# Patient Record
Sex: Female | Born: 1955 | Race: White | Hispanic: No | Marital: Married | Smoking: Never smoker
Health system: Southern US, Community
[De-identification: ages and names within clinical notes are randomized; demographics above are authoritative.]

## PROBLEM LIST (undated history)

## (undated) DIAGNOSIS — I10 Essential (primary) hypertension: Secondary | ICD-10-CM

## (undated) DIAGNOSIS — E7800 Pure hypercholesterolemia, unspecified: Secondary | ICD-10-CM

## (undated) DIAGNOSIS — R7303 Prediabetes: Secondary | ICD-10-CM

## (undated) DIAGNOSIS — E782 Mixed hyperlipidemia: Secondary | ICD-10-CM

## (undated) HISTORY — DX: Mixed hyperlipidemia: E78.2

## (undated) HISTORY — DX: Pure hypercholesterolemia, unspecified: E78.00

## (undated) HISTORY — PX: ABDOMINAL HYSTERECTOMY: SHX81

---

## 1983-06-12 HISTORY — PX: TUBAL LIGATION: SHX77

## 2009-07-05 ENCOUNTER — Ambulatory Visit: Payer: Self-pay | Admitting: General Practice

## 2011-11-09 ENCOUNTER — Ambulatory Visit: Payer: Self-pay | Admitting: Unknown Physician Specialty

## 2012-01-29 ENCOUNTER — Ambulatory Visit: Payer: Self-pay | Admitting: General Practice

## 2014-02-04 ENCOUNTER — Ambulatory Visit: Payer: Self-pay | Admitting: General Practice

## 2014-12-14 ENCOUNTER — Other Ambulatory Visit: Payer: Self-pay | Admitting: Physician Assistant

## 2014-12-14 ENCOUNTER — Ambulatory Visit
Admission: RE | Admit: 2014-12-14 | Discharge: 2014-12-14 | Disposition: A | Discharge status: Home / Self Care | Payer: PRIVATE HEALTH INSURANCE | Source: Ambulatory Visit | Attending: Physician Assistant | Admitting: Physician Assistant

## 2014-12-14 DIAGNOSIS — R05 Cough: Secondary | ICD-10-CM

## 2014-12-14 DIAGNOSIS — R059 Cough, unspecified: Secondary | ICD-10-CM

## 2015-05-31 ENCOUNTER — Ambulatory Visit: Payer: Self-pay | Admitting: Physician Assistant

## 2015-05-31 ENCOUNTER — Encounter: Payer: Self-pay | Admitting: Physician Assistant

## 2015-05-31 VITALS — BP 140/98 | HR 119 | Temp 98.1°F

## 2015-05-31 DIAGNOSIS — J069 Acute upper respiratory infection, unspecified: Secondary | ICD-10-CM

## 2015-05-31 DIAGNOSIS — J209 Acute bronchitis, unspecified: Secondary | ICD-10-CM

## 2015-05-31 MED ORDER — HYDROCOD POLST-CPM POLST ER 10-8 MG/5ML PO SUER: 5.0000 mL | Freq: Two times a day (BID) | ORAL | Status: DC | PRN

## 2015-05-31 MED ORDER — AZITHROMYCIN 250 MG PO TABS: ORAL_TABLET | ORAL | Status: DC

## 2015-05-31 MED ORDER — METHYLPREDNISOLONE 4 MG PO TBPK: ORAL_TABLET | ORAL | Status: DC

## 2015-05-31 MED ORDER — FLUTICASONE PROPIONATE 50 MCG/ACT NA SUSP: 2.0000 | Freq: Every day | NASAL | Status: DC

## 2015-05-31 NOTE — Progress Notes (Signed)
S: C/o runny nose and congestion for 3 days, cough, sinus pain and pressure,  no fever, chills, cp/sob, v/d; mucus was green this am but clear throughout the day, cough is sporadic, keeping her awake at night  Using otc meds: alka seltzer  O: [id number] wnl, nad,  perrl eomi, normocephalic, tms dull, nasal mucosa red and swollen, throat injected, neck supple no lymph, lungs c t a, cv rrr, neuro intact, cough is dry and hacking  A:  Acute uri, bronchitis  P: zpack, medrol dose pack, tussionex, flonase, continue inhaler; drink fluids, continue regular meds , use otc meds of choice, return if not improving in 5 days, return earlier if worsening

## 2015-06-22 ENCOUNTER — Encounter: Payer: Self-pay | Admitting: Physician Assistant

## 2015-06-22 ENCOUNTER — Ambulatory Visit: Payer: Self-pay | Admitting: Physician Assistant

## 2015-06-22 VITALS — BP 130/90 | HR 111 | Temp 98.3°F

## 2015-06-22 DIAGNOSIS — J069 Acute upper respiratory infection, unspecified: Secondary | ICD-10-CM

## 2015-06-22 DIAGNOSIS — J209 Acute bronchitis, unspecified: Secondary | ICD-10-CM

## 2015-06-22 MED ORDER — AMOXICILLIN-POT CLAVULANATE 875-125 MG PO TABS: 1.0000 | ORAL_TABLET | Freq: Two times a day (BID) | ORAL | Status: DC

## 2015-06-22 MED ORDER — HYDROCOD POLST-CPM POLST ER 10-8 MG/5ML PO SUER: 5.0000 mL | Freq: Two times a day (BID) | ORAL | Status: DC | PRN

## 2015-06-22 MED ORDER — FLUCONAZOLE 150 MG PO TABS: ORAL_TABLET | ORAL | Status: DC

## 2015-06-22 NOTE — Progress Notes (Signed)
S: continued cough and congestion, finished zpack and steroid pack, couldn't tell if that helped, sx have persisted, has a lot of drainage, sinus pain, and teeth hurt, no fever/chills, no cp/sob  O: vitals wnl, nad, tms dull, nasal mucosa red and swollen, throat wnl, neck supple no lymph, lungs c t a, cv rrr, cough is hacking  A: acute uri, bronchitis  P: augmentin, diflucan, tussionex

## 2015-06-26 ENCOUNTER — Other Ambulatory Visit: Payer: Self-pay | Admitting: Family Medicine

## 2016-02-24 ENCOUNTER — Ambulatory Visit: Payer: Self-pay | Admitting: Physician Assistant

## 2016-03-09 ENCOUNTER — Ambulatory Visit: Payer: Self-pay | Admitting: Physician Assistant

## 2016-03-14 ENCOUNTER — Ambulatory Visit
Admission: RE | Admit: 2016-03-14 | Discharge: 2016-03-14 | Disposition: A | Discharge status: Home / Self Care | Payer: Managed Care, Other (non HMO) | Source: Ambulatory Visit | Attending: Physician Assistant | Admitting: Physician Assistant

## 2016-03-14 ENCOUNTER — Encounter: Payer: Self-pay | Admitting: Physician Assistant

## 2016-03-14 ENCOUNTER — Ambulatory Visit: Payer: Self-pay | Admitting: Physician Assistant

## 2016-03-14 ENCOUNTER — Encounter (INDEPENDENT_AMBULATORY_CARE_PROVIDER_SITE_OTHER): Payer: Self-pay

## 2016-03-14 VITALS — BP 155/95 | HR 98 | Temp 98.3°F | Ht 68.0 in | Wt 243.0 lb

## 2016-03-14 DIAGNOSIS — R05 Cough: Secondary | ICD-10-CM | POA: Insufficient documentation

## 2016-03-14 DIAGNOSIS — R059 Cough, unspecified: Secondary | ICD-10-CM

## 2016-03-14 DIAGNOSIS — Z299 Encounter for prophylactic measures, unspecified: Secondary | ICD-10-CM

## 2016-03-14 DIAGNOSIS — I10 Essential (primary) hypertension: Secondary | ICD-10-CM

## 2016-03-14 MED ORDER — HYDROCHLOROTHIAZIDE 25 MG PO TABS: 25.0000 mg | ORAL_TABLET | Freq: Every day | ORAL | 4 refills | Status: DC

## 2016-03-14 NOTE — Progress Notes (Signed)
S: here for yearly physical, needs mammogram, had normal pap at gyn 2 years ago, never had any issues with pap smears, had colonoscopy few years ago, not time for recheck, ran out of hctz about 2 months ago, bp up since then, denies headaches, abd pain, chest pain, sob, v/d, or dif with urination; does state she has a chronic cough, is dry and hacking  O: vitals wnl, nad, ENT wnl, neck supple no lymph, lungs c t a, cv rrr, no bruits noted at carotids, abd soft nontender bs normal, breast exam normal, no lumps or masses palpated, neuro intact  A: htn, cough, preventive measure  P: mammogram, cxr, ekg for baseline, hctz 25mg  qd #30

## 2016-03-15 LAB — CMP12+LP+TP+TSH+6AC+CBC/D/PLT
A/G RATIO: 1.5 (ref 1.2–2.2)
ALK PHOS: 66 IU/L (ref 39–117)
ALT: 14 IU/L (ref 0–32)
AST: 14 IU/L (ref 0–40)
Albumin: 4.1 g/dL (ref 3.6–4.8)
BASOS: 1 %
BILIRUBIN TOTAL: 0.4 mg/dL (ref 0.0–1.2)
BUN/Creatinine Ratio: 19 (ref 12–28)
BUN: 14 mg/dL (ref 8–27)
Basophils Absolute: 0.1 10*3/uL (ref 0.0–0.2)
CALCIUM: 9.4 mg/dL (ref 8.7–10.3)
CHOL/HDL RATIO: 4.1 ratio (ref 0.0–4.4)
Chloride: 105 mmol/L (ref 96–106)
Cholesterol, Total: 206 mg/dL — ABNORMAL HIGH (ref 100–199)
Creatinine, Ser: 0.74 mg/dL (ref 0.57–1.00)
EOS (ABSOLUTE): 0.3 10*3/uL (ref 0.0–0.4)
Eos: 4 %
Estimated CHD Risk: 0.9 times avg. (ref 0.0–1.0)
FREE THYROXINE INDEX: 2.2 (ref 1.2–4.9)
GFR calc non Af Amer: 88 mL/min/{1.73_m2} (ref >59)
GFR, EST AFRICAN AMERICAN: 101 mL/min/{1.73_m2} (ref >59)
GGT: 18 IU/L (ref 0–60)
GLOBULIN, TOTAL: 2.8 g/dL (ref 1.5–4.5)
Glucose: 113 mg/dL — ABNORMAL HIGH (ref 65–99)
HDL: 50 mg/dL (ref >39)
HEMATOCRIT: 44.3 % (ref 34.0–46.6)
Hemoglobin: 15.2 g/dL (ref 11.1–15.9)
IMMATURE GRANS (ABS): 0 10*3/uL (ref 0.0–0.1)
IMMATURE GRANULOCYTES: 1 %
Iron: 103 ug/dL (ref 27–139)
LDH: 177 IU/L (ref 119–226)
LDL CALC: 130 mg/dL — AB (ref 0–99)
LYMPHS: 42 %
Lymphocytes Absolute: 3 10*3/uL (ref 0.7–3.1)
MCH: 30.8 pg (ref 26.6–33.0)
MCHC: 34.3 g/dL (ref 31.5–35.7)
MCV: 90 fL (ref 79–97)
MONOCYTES: 8 %
Monocytes Absolute: 0.5 10*3/uL (ref 0.1–0.9)
NEUTROS ABS: 3.1 10*3/uL (ref 1.4–7.0)
NEUTROS PCT: 44 %
POTASSIUM: 4.6 mmol/L (ref 3.5–5.2)
Phosphorus: 2.9 mg/dL (ref 2.5–4.5)
Platelets: 312 10*3/uL (ref 150–379)
RBC: 4.94 x10E6/uL (ref 3.77–5.28)
RDW: 12.6 % (ref 12.3–15.4)
SODIUM: 143 mmol/L (ref 134–144)
T3 Uptake Ratio: 26 % (ref 24–39)
T4, Total: 8.6 ug/dL (ref 4.5–12.0)
TSH: 2.61 u[IU]/mL (ref 0.450–4.500)
Total Protein: 6.9 g/dL (ref 6.0–8.5)
Triglycerides: 128 mg/dL (ref 0–149)
Uric Acid: 4.9 mg/dL (ref 2.5–7.1)
VLDL CHOLESTEROL CAL: 26 mg/dL (ref 5–40)
WBC: 7.1 10*3/uL (ref 3.4–10.8)

## 2016-03-15 LAB — VITAMIN D 25 HYDROXY (VIT D DEFICIENCY, FRACTURES): Vit D, 25-Hydroxy: 29 ng/mL — ABNORMAL LOW (ref 30.0–100.0)

## 2016-03-15 LAB — HCV COMMENT:

## 2016-03-15 LAB — HEPATITIS C ANTIBODY (REFLEX): HCV Ab: <0.1 s/co ratio (ref 0.0–0.9)

## 2016-03-21 LAB — SPECIMEN STATUS REPORT

## 2016-03-21 LAB — HGB A1C W/O EAG: HEMOGLOBIN A1C: 5.4 % (ref 4.8–5.6)

## 2016-04-24 ENCOUNTER — Ambulatory Visit
Admission: RE | Admit: 2016-04-24 | Discharge: 2016-04-24 | Disposition: A | Discharge status: Home / Self Care | Payer: Managed Care, Other (non HMO) | Source: Ambulatory Visit | Attending: Physician Assistant | Admitting: Physician Assistant

## 2016-04-24 DIAGNOSIS — Z1231 Encounter for screening mammogram for malignant neoplasm of breast: Secondary | ICD-10-CM | POA: Insufficient documentation

## 2016-05-09 ENCOUNTER — Encounter: Payer: Self-pay | Admitting: Physician Assistant

## 2016-05-09 ENCOUNTER — Ambulatory Visit: Payer: Self-pay | Admitting: Physician Assistant

## 2016-05-09 VITALS — BP 130/80 | HR 104 | Temp 98.1°F

## 2016-05-09 DIAGNOSIS — J069 Acute upper respiratory infection, unspecified: Secondary | ICD-10-CM

## 2016-05-09 LAB — POCT INFLUENZA A/B
Influenza A, POC: NEGATIVE
Influenza B, POC: NEGATIVE

## 2016-05-09 MED ORDER — CEFDINIR 300 MG PO CAPS: 300.0000 mg | ORAL_CAPSULE | Freq: Two times a day (BID) | ORAL | 0 refills | Status: DC

## 2016-05-09 MED ORDER — HYDROCOD POLST-CPM POLST ER 10-8 MG/5ML PO SUER: 5.0000 mL | Freq: Two times a day (BID) | ORAL | 0 refills | Status: DC | PRN

## 2016-05-09 NOTE — Progress Notes (Signed)
S: C/o runny nose and congestion with dry cough for 3 days, + fever, chills on Sunday and MOnday, none now, denies cp/sob, v/d; mucus was green this am,  cough is sporadic, hacking  Using otc meds: robitussin  O: PE: vitals wnl, nad,  perrl eomi, normocephalic, tms dull, nasal mucosa red and swollen, throat injected, neck supple no lymph, lungs c t a, cv rrr, neuro intact, flu swab neg  A:  Acute uri   P: drink fluids, continue regular meds , use otc meds of choice, return if not improving in 5 days, return earlier if worsening , omnicef, tussionex 180ml nr

## 2016-10-05 ENCOUNTER — Ambulatory Visit: Payer: Self-pay | Admitting: Physician Assistant

## 2016-10-05 VITALS — BP 160/90 | HR 112 | Temp 98.4°F

## 2016-10-05 DIAGNOSIS — R21 Rash and other nonspecific skin eruption: Secondary | ICD-10-CM

## 2016-10-05 MED ORDER — CLOTRIMAZOLE-BETAMETHASONE 1-0.05 % EX CREA: 1.0000 "application " | TOPICAL_CREAM | Freq: Two times a day (BID) | CUTANEOUS | 0 refills | Status: DC

## 2016-10-05 MED ORDER — ALBUTEROL SULFATE HFA 108 (90 BASE) MCG/ACT IN AERS: 2.0000 | INHALATION_SPRAY | Freq: Four times a day (QID) | RESPIRATORY_TRACT | 0 refills | Status: DC | PRN

## 2016-10-05 MED ORDER — IPRATROPIUM-ALBUTEROL 0.5-2.5 (3) MG/3ML IN SOLN
3.0000 mL | Freq: Once | RESPIRATORY_TRACT | Status: AC
  Administered 2016-10-05: 3 mL via RESPIRATORY_TRACT

## 2016-10-05 MED ORDER — IPRATROPIUM-ALBUTEROL 0.5-2.5 (3) MG/3ML IN SOLN: 3.0000 mL | Freq: Four times a day (QID) | RESPIRATORY_TRACT | Status: DC

## 2016-10-05 MED ORDER — DEXAMETHASONE SODIUM PHOSPHATE 10 MG/ML IJ SOLN: 10.0000 mg | Freq: Once | INTRAMUSCULAR | Status: DC

## 2016-10-05 MED ORDER — CEFDINIR 300 MG PO CAPS: 300.0000 mg | ORAL_CAPSULE | Freq: Two times a day (BID) | ORAL | 0 refills | Status: DC

## 2016-10-05 MED ORDER — METHYLPREDNISOLONE 4 MG PO TBPK: ORAL_TABLET | ORAL | 0 refills | Status: DC

## 2016-10-05 NOTE — Progress Notes (Signed)
S: C/o cough and congestion with wheezing and chest pain, chest is sore from coughing, denies fever, chills, mucus is clear, but mostly cough is dry and hacking; keeping pt awake at night;  denies cardiac type chest pain or sob, v/d, abd pain Remainder ros neg  O: vitals wnl, nad, tms clear, throat injected, neck supple no lymph, lungs with decreased air movement, cv rrr, neuro intact, svn duoneb   A:  Acute bronchitis   P:  rx medication:  Medrol dose pack, albuterol inhaler, omnicef,  use otc meds, tylenol or motrin as needed for fever/chills, return if not better in 3 -5 days, return earlier if worsening

## 2016-10-05 NOTE — Addendum Note (Signed)
Addended by: Rudene Anda T on: 10/05/2016 02:15 PM   Modules accepted: Orders

## 2016-10-05 NOTE — Addendum Note (Signed)
Addended by: Rudene Anda T on: 10/05/2016 02:18 PM   Modules accepted: Orders

## 2016-11-20 ENCOUNTER — Telehealth: Payer: Self-pay | Admitting: Emergency Medicine

## 2016-11-20 NOTE — Telephone Encounter (Signed)
Debbie from [facility] Gastroenterology department called on behave of Dr. Vira Agar and requested the patient's notes and labs from when she had her physical in October of 2017. Dr. Vira Agar is scheduled to perform a colonoscopy on the patient.

## 2017-03-07 ENCOUNTER — Ambulatory Visit: Payer: Self-pay | Admitting: Physician Assistant

## 2017-03-07 ENCOUNTER — Encounter: Payer: Self-pay | Admitting: Physician Assistant

## 2017-03-07 VITALS — BP 160/95 | HR 116 | Temp 98.5°F | Resp 16

## 2017-03-07 DIAGNOSIS — F411 Generalized anxiety disorder: Secondary | ICD-10-CM

## 2017-03-07 NOTE — Progress Notes (Signed)
S: c/o anxiety related to work, states she is trying to retire and they want her to work longer, is having difficulty sleeping as she is afraid she will be fired for trying to retire in next 3 months, no si/hi, stress is worse when she goes to work  O: vitals w elevated bp, lungs c t a, cv rrr, pt is nervous  A: anxiety  P: pt to talk with her supervisor, will fill out fmla forms starting with today if needed

## 2017-03-13 ENCOUNTER — Other Ambulatory Visit: Payer: Self-pay | Admitting: Physician Assistant

## 2017-03-14 ENCOUNTER — Encounter: Payer: Self-pay | Admitting: *Deleted

## 2017-03-15 ENCOUNTER — Ambulatory Visit
Admission: RE | Admit: 2017-03-15 | Discharge: 2017-03-15 | Disposition: A | Discharge status: Home / Self Care | Payer: Managed Care, Other (non HMO) | Source: Ambulatory Visit | Attending: Unknown Physician Specialty | Admitting: Unknown Physician Specialty

## 2017-03-15 ENCOUNTER — Encounter
Admission: RE | Disposition: A | Discharge status: Home / Self Care | Payer: Self-pay | Source: Ambulatory Visit | Attending: Unknown Physician Specialty

## 2017-03-15 ENCOUNTER — Ambulatory Visit: Payer: Managed Care, Other (non HMO) | Admitting: Anesthesiology

## 2017-03-15 DIAGNOSIS — D122 Benign neoplasm of ascending colon: Secondary | ICD-10-CM | POA: Insufficient documentation

## 2017-03-15 DIAGNOSIS — D127 Benign neoplasm of rectosigmoid junction: Secondary | ICD-10-CM | POA: Diagnosis not present

## 2017-03-15 DIAGNOSIS — D124 Benign neoplasm of descending colon: Secondary | ICD-10-CM | POA: Insufficient documentation

## 2017-03-15 DIAGNOSIS — D120 Benign neoplasm of cecum: Secondary | ICD-10-CM | POA: Diagnosis not present

## 2017-03-15 DIAGNOSIS — I10 Essential (primary) hypertension: Secondary | ICD-10-CM | POA: Insufficient documentation

## 2017-03-15 DIAGNOSIS — K640 First degree hemorrhoids: Secondary | ICD-10-CM | POA: Insufficient documentation

## 2017-03-15 DIAGNOSIS — Z79899 Other long term (current) drug therapy: Secondary | ICD-10-CM | POA: Insufficient documentation

## 2017-03-15 DIAGNOSIS — Z1211 Encounter for screening for malignant neoplasm of colon: Secondary | ICD-10-CM | POA: Diagnosis not present

## 2017-03-15 HISTORY — PX: COLONOSCOPY WITH PROPOFOL: SHX5780

## 2017-03-15 HISTORY — DX: Essential (primary) hypertension: I10

## 2017-03-15 SURGERY — COLONOSCOPY WITH PROPOFOL
Anesthesia: General

## 2017-03-15 MED ORDER — MIDAZOLAM HCL 5 MG/5ML IJ SOLN
INTRAMUSCULAR | Status: DC | PRN
  Administered 2017-03-15 (×2): 1 mg via INTRAVENOUS

## 2017-03-15 MED ORDER — FENTANYL CITRATE (PF) 100 MCG/2ML IJ SOLN
INTRAMUSCULAR | Status: AC
  Filled 2017-03-15: qty 2

## 2017-03-15 MED ORDER — LIDOCAINE HCL (PF) 2 % IJ SOLN
INTRAMUSCULAR | Status: AC
  Filled 2017-03-15: qty 10

## 2017-03-15 MED ORDER — MIDAZOLAM HCL 2 MG/2ML IJ SOLN
INTRAMUSCULAR | Status: AC
  Filled 2017-03-15: qty 2

## 2017-03-15 MED ORDER — SODIUM CHLORIDE 0.9 % IV SOLN
INTRAVENOUS | Status: DC
  Administered 2017-03-15: 10:00:00 via INTRAVENOUS

## 2017-03-15 MED ORDER — SODIUM CHLORIDE 0.9 % IV SOLN: INTRAVENOUS | Status: DC

## 2017-03-15 MED ORDER — FENTANYL CITRATE (PF) 100 MCG/2ML IJ SOLN
INTRAMUSCULAR | Status: DC | PRN
  Administered 2017-03-15 (×2): 50 ug via INTRAVENOUS

## 2017-03-15 MED ORDER — PROPOFOL 500 MG/50ML IV EMUL
INTRAVENOUS | Status: DC | PRN
  Administered 2017-03-15: 75 ug/kg/min via INTRAVENOUS

## 2017-03-15 MED ORDER — GLYCOPYRROLATE 0.2 MG/ML IJ SOLN
INTRAMUSCULAR | Status: AC
  Filled 2017-03-15: qty 1

## 2017-03-15 MED ORDER — EPHEDRINE SULFATE 50 MG/ML IJ SOLN
INTRAMUSCULAR | Status: AC
  Filled 2017-03-15: qty 1

## 2017-03-15 MED ORDER — PROPOFOL 10 MG/ML IV BOLUS
INTRAVENOUS | Status: DC | PRN
  Administered 2017-03-15: 20 mg via INTRAVENOUS
  Administered 2017-03-15: 30 mg via INTRAVENOUS

## 2017-03-15 MED ORDER — PROPOFOL 500 MG/50ML IV EMUL
INTRAVENOUS | Status: AC
  Filled 2017-03-15: qty 50

## 2017-03-15 MED ORDER — LIDOCAINE HCL (PF) 2 % IJ SOLN
INTRAMUSCULAR | Status: DC | PRN
  Administered 2017-03-15: 80 mg

## 2017-03-15 NOTE — Anesthesia Post-op Follow-up Note (Signed)
Anesthesia QCDR form completed.        

## 2017-03-15 NOTE — Op Note (Signed)
[facility] Gastroenterology Patient Name: Pamela Dixon Procedure Date: 03/15/2017 10:37 AM MRN: [id number] Account #: [id number] Date of Birth: 07/16/1955 Admit Type: Outpatient Age: 61 Room: [facility] ENDO ROOM 1 Gender: Female Note Status: Finalized Procedure:            Colonoscopy Indications:          Screening for colorectal malignant neoplasm Providers:            Manya Silvas, MD Referring MD:         Versie Starks, MD (Referring MD) Medicines:            Propofol per Anesthesia Complications:        No immediate complications. Procedure:            Pre-Anesthesia Assessment:                       - After reviewing the risks and benefits, the patient                        was deemed in satisfactory condition to undergo the                        procedure.                       After obtaining informed consent, the colonoscope was                        passed under direct vision. Throughout the procedure,                        the patient's blood pressure, pulse, and oxygen                        saturations were monitored continuously. The                        Colonoscope was introduced through the anus and                        advanced to the the cecum, identified by appendiceal                        orifice and ileocecal valve. The colonoscopy was                        performed without difficulty. The patient tolerated the                        procedure well. The quality of the bowel preparation                        was good. Findings:      Three sessile polyps were found in the recto-sigmoid colon, ascending       colon and cecum. The polyps were diminutive in size. These polyps were       removed with a jumbo cold forceps. Resection and retrieval were complete.      A small polyp was found in the descending colon. The polyp was sessile.       The polyp was removed with a hot  snare. Resection and retrieval were       complete.  Internal hemorrhoids were found during endoscopy. The hemorrhoids were       small and Grade I (internal hemorrhoids that do not prolapse).      The exam was otherwise without abnormality. Impression:           - Three diminutive polyps at the recto-sigmoid colon,                        in the ascending colon and in the cecum, removed with a                        jumbo cold forceps. Resected and retrieved.                       - One small polyp in the descending colon, removed with                        a hot snare. Resected and retrieved.                       - Internal hemorrhoids.                       - The examination was otherwise normal. Recommendation:       - Await pathology results. Manya Silvas, MD 03/15/2017 11:09:57 AM This report has been signed electronically. Number of Addenda: 0 Note Initiated On: 03/15/2017 10:37 AM Scope Withdrawal Time: 0 hours 18 minutes 12 seconds  Total Procedure Duration: 0 hours 23 minutes 5 seconds       [facility]

## 2017-03-15 NOTE — Anesthesia Postprocedure Evaluation (Signed)
Anesthesia Post Note  Patient: Pamela Dixon  Procedure(s) Performed: COLONOSCOPY WITH PROPOFOL (N/A )  Patient location during evaluation: Endoscopy Anesthesia Type: General Level of consciousness: awake and alert Pain management: pain level controlled Vital Signs Assessment: post-procedure vital signs reviewed and stable Respiratory status: spontaneous breathing and respiratory function stable Cardiovascular status: stable Anesthetic complications: no     Last Vitals:  Vitals:   03/15/17 0940 03/15/17 1113  BP: (!) 152/99 116/62  Pulse: 91 82  Resp: (!) 91 (!) 22  Temp: 37.6 C (!) 35.8 C  SpO2: 99% 100%    Last Pain:  Vitals:   03/15/17 1113  TempSrc: Tympanic                 KEPHART,WILLIAM K

## 2017-03-15 NOTE — OR Nursing (Signed)
EGD cancelled per pt and MD in pre-op.

## 2017-03-15 NOTE — Anesthesia Preprocedure Evaluation (Signed)
Anesthesia Evaluation  Patient identified by MRN, date of birth, ID band Patient awake    Reviewed: Allergy & Precautions, NPO status , Patient's Chart, lab work & pertinent test results  Airway Mallampati: III       Dental   Pulmonary neg sleep apnea, neg COPD,           Cardiovascular (-) hypertension(-) Past MI and (-) CHF (-) dysrhythmias (-) Valvular Problems/Murmurs     Neuro/Psych neg Seizures    GI/Hepatic Neg liver ROS, neg GERD  ,  Endo/Other  neg diabetes  Renal/GU negative Renal ROS     Musculoskeletal   Abdominal   Peds  Hematology   Anesthesia Other Findings   Reproductive/Obstetrics                             Anesthesia Physical Anesthesia Plan  ASA: II  Anesthesia Plan: General   Post-op Pain Management:    Induction: Intravenous  PONV Risk Score and Plan: 3 and Ondansetron, Dexamethasone, Midazolam and Propofol infusion  Airway Management Planned: Nasal Cannula  Additional Equipment:   Intra-op Plan:   Post-operative Plan:   Informed Consent: I have reviewed the patients History and Physical, chart, labs and discussed the procedure including the risks, benefits and alternatives for the proposed anesthesia with the patient or authorized representative who has indicated his/her understanding and acceptance.     Plan Discussed with:   Anesthesia Plan Comments:         Anesthesia Quick Evaluation

## 2017-03-15 NOTE — Transfer of Care (Signed)
Immediate Anesthesia Transfer of Care Note  Patient: Pamela Dixon  Procedure(s) Performed: COLONOSCOPY WITH PROPOFOL (N/A )  Patient Location: PACU  Anesthesia Type:General  Level of Consciousness: sedated  Airway & Oxygen Therapy: Patient Spontanous Breathing and Patient connected to nasal cannula oxygen  Post-op Assessment: Report given to RN and Post -op Vital signs reviewed and stable  Post vital signs: Reviewed and stable  Last Vitals:  Vitals:   03/15/17 0940  BP: (!) 152/99  Pulse: 91  Resp: (!) 91  Temp: 37.6 C  SpO2: 99%    Last Pain:  Vitals:   03/15/17 0940  TempSrc: Tympanic         Complications: No apparent anesthesia complications

## 2017-03-15 NOTE — H&P (Signed)
   Primary Care Physician:  Versie Starks, PA-C Primary Gastroenterologist:  Dr. Vira Agar  Pre-Procedure History & Physical: HPI:  Pamela Dixon is a 61 y.o. female is here for an colonoscopy.   Past Medical History:  Diagnosis Date  . Hypertension     History reviewed. No pertinent surgical history.  Prior to Admission medications   Medication Sig Start Date End Date Taking? Authorizing Provider  albuterol (PROVENTIL HFA;VENTOLIN HFA) 108 (90 Base) MCG/ACT inhaler Inhale 2 puffs into the lungs every 6 (six) hours as needed for wheezing or shortness of breath. Patient not taking: Reported on 03/07/2017 10/05/16   Versie Starks, PA-C  cefdinir (OMNICEF) 300 MG capsule Take 1 capsule (300 mg total) by mouth 2 (two) times daily. Patient not taking: Reported on 03/07/2017 10/05/16   Versie Starks, PA-C  chlorpheniramine-HYDROcodone [facility] PENNKINETIC ER) 10-8 MG/5ML SUER Take 5 mLs by mouth every 12 (twelve) hours as needed for cough. Patient not taking: Reported on 10/05/2016 05/09/16   Versie Starks, PA-C  fluticasone [facility]) 50 MCG/ACT nasal spray Place 2 sprays into both nostrils daily. Patient not taking: Reported on 10/05/2016 05/31/15   Versie Starks, PA-C  hydrochlorothiazide (HYDRODIURIL) 25 MG tablet Take 1 tablet (25 mg total) by mouth daily. 03/14/16   Fisher, Linden Dolin, PA-C  methylPREDNISolone (MEDROL DOSEPAK) 4 MG TBPK tablet Take 6 pills on day one then decrease by 1 pill each day Patient not taking: Reported on 03/07/2017 10/05/16   Versie Starks, PA-C    Allergies as of 11/27/2016  . (No Known Allergies)    Family History  Problem Relation Age of Onset  . Breast cancer Neg Hx     Social History   Social History  . Marital status: Married    Spouse name: N/A  . Number of children: N/A  . Years of education: N/A   Occupational History  . Not on file.   Social History Main Topics  . Smoking status: Never Smoker  . Smokeless tobacco: Never Used  .  Alcohol use No  . Drug use: No  . Sexual activity: Not on file   Other Topics Concern  . Not on file   Social History Narrative  . No narrative on file    Review of Systems: See HPI, otherwise negative ROS  Physical Exam: BP (!) 152/99   Pulse 91   Temp 99.6 F (37.6 C) (Tympanic)   Resp (!) 91   Ht 5\' 8"  (1.727 m)   Wt 108.9 kg (240 lb)   SpO2 99%   BMI 36.49 kg/m  General:   Alert,  pleasant and cooperative in NAD Head:  Normocephalic and atraumatic. Neck:  Supple; no masses or thyromegaly. Lungs:  Clear throughout to auscultation.    Heart:  Regular rate and rhythm. Abdomen:  Soft, nontender and nondistended. Normal bowel sounds, without guarding, and without rebound.   Neurologic:  Alert and  oriented x4;  grossly normal neurologically.  Impression/Plan: CHEREESE CILENTO is here for an colonoscopy to be performed for screening.  Risks, benefits, limitations, and alternatives regarding  colonoscopy have been reviewed with the patient.  Questions have been answered.  All parties agreeable.   Gaylyn Cheers, MD  03/15/2017, 10:28 AM

## 2017-03-18 ENCOUNTER — Encounter: Payer: Self-pay | Admitting: Unknown Physician Specialty

## 2017-03-18 LAB — SURGICAL PATHOLOGY

## 2017-04-15 ENCOUNTER — Encounter: Payer: Self-pay | Admitting: Physician Assistant

## 2017-04-15 ENCOUNTER — Ambulatory Visit: Payer: Self-pay | Admitting: Physician Assistant

## 2017-04-15 VITALS — BP 165/105 | HR 95 | Temp 98.5°F | Resp 16 | Ht 67.0 in | Wt 246.0 lb

## 2017-04-15 DIAGNOSIS — Z299 Encounter for prophylactic measures, unspecified: Secondary | ICD-10-CM

## 2017-04-15 DIAGNOSIS — Z0000 Encounter for general adult medical examination without abnormal findings: Secondary | ICD-10-CM

## 2017-04-15 DIAGNOSIS — Z0189 Encounter for other specified special examinations: Secondary | ICD-10-CM

## 2017-04-15 DIAGNOSIS — Z008 Encounter for other general examination: Secondary | ICD-10-CM

## 2017-04-15 MED ORDER — LOSARTAN POTASSIUM 50 MG PO TABS: 50.0000 mg | ORAL_TABLET | Freq: Every day | ORAL | 3 refills | Status: DC

## 2017-04-15 NOTE — Progress Notes (Signed)
S: pt here for wellness physical and biometrics for insurance purposes, no complaints ros neg. PMH:   htn Social: nonsmoker,  Fam: mother died of brain disease CJD; father is alive +DM, siblings are healthy  O: vitals wnl, nad, ENT wnl, neck supple no lymph, lungs c t a, cv rrr, abd soft nontender bs normal all 4 quads  A: wellness, biometric physical  P: mm ordered, tdap given in clinic, shingles vaccine order, losartan 50mg  qd

## 2017-04-16 LAB — CMP12+LP+TP+TSH+6AC+CBC/D/PLT
ALBUMIN: 4.5 g/dL (ref 3.6–4.8)
ALT: 15 IU/L (ref 0–32)
AST: 17 IU/L (ref 0–40)
Albumin/Globulin Ratio: 1.8 (ref 1.2–2.2)
Alkaline Phosphatase: 64 IU/L (ref 39–117)
BUN/Creatinine Ratio: 22 (ref 12–28)
BUN: 17 mg/dL (ref 8–27)
Basophils Absolute: 0.1 10*3/uL (ref 0.0–0.2)
Basos: 1 %
Bilirubin Total: 0.4 mg/dL (ref 0.0–1.2)
CALCIUM: 9.3 mg/dL (ref 8.7–10.3)
CHOL/HDL RATIO: 3.9 ratio (ref 0.0–4.4)
CREATININE: 0.76 mg/dL (ref 0.57–1.00)
Chloride: 107 mmol/L — ABNORMAL HIGH (ref 96–106)
Cholesterol, Total: 208 mg/dL — ABNORMAL HIGH (ref 100–199)
EOS (ABSOLUTE): 0.3 10*3/uL (ref 0.0–0.4)
Eos: 4 %
Estimated CHD Risk: 0.8 times avg. (ref 0.0–1.0)
Free Thyroxine Index: 2 (ref 1.2–4.9)
GFR calc Af Amer: 97 mL/min/{1.73_m2} (ref >59)
GFR, EST NON AFRICAN AMERICAN: 84 mL/min/{1.73_m2} (ref >59)
GGT: 20 IU/L (ref 0–60)
GLOBULIN, TOTAL: 2.5 g/dL (ref 1.5–4.5)
Glucose: 101 mg/dL — ABNORMAL HIGH (ref 65–99)
HDL: 53 mg/dL (ref >39)
Hematocrit: 45.5 % (ref 34.0–46.6)
Hemoglobin: 15.5 g/dL (ref 11.1–15.9)
IRON: 147 ug/dL — AB (ref 27–139)
Immature Grans (Abs): 0 10*3/uL (ref 0.0–0.1)
Immature Granulocytes: 0 %
LDH: 199 IU/L (ref 119–226)
LDL Calculated: 127 mg/dL — ABNORMAL HIGH (ref 0–99)
LYMPHS: 42 %
Lymphocytes Absolute: 2.9 10*3/uL (ref 0.7–3.1)
MCH: 30.9 pg (ref 26.6–33.0)
MCHC: 34.1 g/dL (ref 31.5–35.7)
MCV: 91 fL (ref 79–97)
MONOS ABS: 0.6 10*3/uL (ref 0.1–0.9)
Monocytes: 9 %
NEUTROS ABS: 3.1 10*3/uL (ref 1.4–7.0)
Neutrophils: 44 %
PHOSPHORUS: 3 mg/dL (ref 2.5–4.5)
POTASSIUM: 4.9 mmol/L (ref 3.5–5.2)
Platelets: 277 10*3/uL (ref 150–379)
RBC: 5.01 x10E6/uL (ref 3.77–5.28)
RDW: 12.7 % (ref 12.3–15.4)
Sodium: 145 mmol/L — ABNORMAL HIGH (ref 134–144)
T3 Uptake Ratio: 23 % — ABNORMAL LOW (ref 24–39)
T4 TOTAL: 8.5 ug/dL (ref 4.5–12.0)
TOTAL PROTEIN: 7 g/dL (ref 6.0–8.5)
TRIGLYCERIDES: 139 mg/dL (ref 0–149)
TSH: 2.74 u[IU]/mL (ref 0.450–4.500)
Uric Acid: 4.4 mg/dL (ref 2.5–7.1)
VLDL Cholesterol Cal: 28 mg/dL (ref 5–40)
WBC: 7 10*3/uL (ref 3.4–10.8)

## 2017-04-16 LAB — VITAMIN D 25 HYDROXY (VIT D DEFICIENCY, FRACTURES): VIT D 25 HYDROXY: 24.9 ng/mL — AB (ref 30.0–100.0)

## 2017-04-16 LAB — HGB A1C W/O EAG: HEMOGLOBIN A1C: 5.5 % (ref 4.8–5.6)

## 2017-04-17 ENCOUNTER — Encounter: Payer: Self-pay | Admitting: Physician Assistant

## 2017-05-08 ENCOUNTER — Ambulatory Visit
Admission: RE | Admit: 2017-05-08 | Discharge: 2017-05-08 | Disposition: A | Discharge status: Home / Self Care | Payer: Managed Care, Other (non HMO) | Source: Ambulatory Visit | Attending: Physician Assistant | Admitting: Physician Assistant

## 2017-05-08 DIAGNOSIS — Z1231 Encounter for screening mammogram for malignant neoplasm of breast: Secondary | ICD-10-CM | POA: Insufficient documentation

## 2017-05-08 DIAGNOSIS — Z299 Encounter for prophylactic measures, unspecified: Secondary | ICD-10-CM

## 2018-05-02 ENCOUNTER — Ambulatory Visit: Payer: Self-pay | Admitting: Emergency Medicine

## 2018-05-02 VITALS — BP 160/100 | HR 91 | Resp 14 | Ht 66.0 in | Wt 235.0 lb

## 2018-05-02 DIAGNOSIS — I10 Essential (primary) hypertension: Secondary | ICD-10-CM

## 2018-05-02 DIAGNOSIS — Z299 Encounter for prophylactic measures, unspecified: Secondary | ICD-10-CM

## 2018-05-02 DIAGNOSIS — D751 Secondary polycythemia: Secondary | ICD-10-CM

## 2018-05-02 MED ORDER — LOSARTAN POTASSIUM 50 MG PO TABS: 50.0000 mg | ORAL_TABLET | Freq: Every day | ORAL | 0 refills | Status: DC

## 2018-05-02 NOTE — Progress Notes (Signed)
Subjective. Patient here to obtain a prescription for losartan which she currently takes for blood pressure.  She has established herself with a new primary care provider Dr. Lavonia Drafts at [facility].  She has an appointment to see him at the end of December.  She has a healthy lifestyle with no smoking and minimal social drinking.  She does not use any drugs.  She does not exercise and has not been on a specific diet recently. Review of systems. Patient denies chest pain shortness of breath or swelling of her legs. Objective: Alert cooperative in no distress. Neck supple without adenopathy. Chest clear to auscultation. Heart regular rate and rhythm. Extremities without edema. Assessment: We will go ahead and check a basic metabolic panel today.  Will refill her losartan for a total of 3 months which will give her time to establish with her new PCP.  She also has had a borderline low sodium of 145 which should be repeated and also a hemoglobin which is 15.5 which is on the high end of normal.  Certainly she should have a repeat CBC as well as iron studies to be sure she does not have hemochromatosis cytosis and at least screening with a sed rate and consider Jak 2 screening if her hemoglobin remains high.. Plan. Meds refilled. Complete blood panel with your primary care provider. If hemoglobin is elevated would recommend iron studies, sed rate, and Jak-2 mutation  screen Advise DASH diet and exercise. She has already had her flu shot.

## 2018-05-02 NOTE — Patient Instructions (Signed)
I have refilled your medicine for 3 months. Please follow-up with the DASH diet eating plan. Please see your new primary care provider. I will call you with blood test results. I would suggest testing with a sed rate, Jak 2 mutation screen, and iron studies if hemoglobin remains elevated     DASH Eating Plan DASH stands for "Dietary Approaches to Stop Hypertension." The DASH eating plan is a healthy eating plan that has been shown to reduce high blood pressure (hypertension). It may also reduce your risk for type 2 diabetes, heart disease, and stroke. The DASH eating plan may also help with weight loss. What are tips for following this plan? General guidelines  Avoid eating more than 2,300 mg (milligrams) of salt (sodium) a day. If you have hypertension, you may need to reduce your sodium intake to 1,500 mg a day.  Limit alcohol intake to no more than 1 drink a day for nonpregnant women and 2 drinks a day for men. One drink equals 12 oz of beer, 5 oz of wine, or 1 oz of hard liquor.  Work with your health care provider to maintain a healthy body weight or to lose weight. Ask what an ideal weight is for you.  Get at least 30 minutes of exercise that causes your heart to beat faster (aerobic exercise) most days of the week. Activities may include walking, swimming, or biking.  Work with your health care provider or diet and nutrition specialist (dietitian) to adjust your eating plan to your individual calorie needs. Reading food labels  Check food labels for the amount of sodium per serving. Choose foods with less than 5 percent of the Daily Value of sodium. Generally, foods with less than 300 mg of sodium per serving fit into this eating plan.  To find whole grains, look for the word "whole" as the first word in the ingredient list. Shopping  Buy products labeled as "low-sodium" or "no salt added."  Buy fresh foods. Avoid canned foods and premade or frozen meals. Cooking  Avoid  adding salt when cooking. Use salt-free seasonings or herbs instead of table salt or sea salt. Check with your health care provider or pharmacist before using salt substitutes.  Do not fry foods. Cook foods using healthy methods such as baking, boiling, grilling, and broiling instead.  Cook with heart-healthy oils, such as olive, canola, soybean, or sunflower oil. Meal planning   Eat a balanced diet that includes: ? 5 or more servings of fruits and vegetables each day. At each meal, try to fill half of your plate with fruits and vegetables. ? Up to 6-8 servings of whole grains each day. ? Less than 6 oz of lean meat, poultry, or fish each day. A 3-oz serving of meat is about the same size as a deck of cards. One egg equals 1 oz. ? 2 servings of low-fat dairy each day. ? A serving of nuts, seeds, or beans 5 times each week. ? Heart-healthy fats. Healthy fats called Omega-3 fatty acids are found in foods such as flaxseeds and coldwater fish, like sardines, salmon, and mackerel.  Limit how much you eat of the following: ? Canned or prepackaged foods. ? Food that is high in trans fat, such as fried foods. ? Food that is high in saturated fat, such as fatty meat. ? Sweets, desserts, sugary drinks, and other foods with added sugar. ? Full-fat dairy products.  Do not salt foods before eating.  Try to eat at least 2 vegetarian  meals each week.  Eat more home-cooked food and less restaurant, buffet, and fast food.  When eating at a restaurant, ask that your food be prepared with less salt or no salt, if possible. What foods are recommended? The items listed may not be a complete list. Talk with your dietitian about what dietary choices are best for you. Grains Whole-grain or whole-wheat bread. Whole-grain or whole-wheat pasta. Brown rice. Modena Morrow. Bulgur. Whole-grain and low-sodium cereals. Pita bread. Low-fat, low-sodium crackers. Whole-wheat flour tortillas. Vegetables Fresh or  frozen vegetables (raw, steamed, roasted, or grilled). Low-sodium or reduced-sodium tomato and vegetable juice. Low-sodium or reduced-sodium tomato sauce and tomato paste. Low-sodium or reduced-sodium canned vegetables. Fruits All fresh, dried, or frozen fruit. Canned fruit in natural juice (without added sugar). Meat and other protein foods Skinless chicken or [place]. Ground chicken or [place]. Pork with fat trimmed off. Fish and seafood. Egg whites. Dried beans, peas, or lentils. Unsalted nuts, nut butters, and seeds. Unsalted canned beans. Lean cuts of beef with fat trimmed off. Low-sodium, lean deli meat. Dairy Low-fat (1%) or fat-free (skim) milk. Fat-free, low-fat, or reduced-fat cheeses. Nonfat, low-sodium ricotta or cottage cheese. Low-fat or nonfat yogurt. Low-fat, low-sodium cheese. Fats and oils Soft margarine without trans fats. Vegetable oil. Low-fat, reduced-fat, or light mayonnaise and salad dressings (reduced-sodium). Canola, safflower, olive, soybean, and sunflower oils. Avocado. Seasoning and other foods Herbs. Spices. Seasoning mixes without salt. Unsalted popcorn and pretzels. Fat-free sweets. What foods are not recommended? The items listed may not be a complete list. Talk with your dietitian about what dietary choices are best for you. Grains Baked goods made with fat, such as croissants, muffins, or some breads. Dry pasta or rice meal packs. Vegetables Creamed or fried vegetables. Vegetables in a cheese sauce. Regular canned vegetables (not low-sodium or reduced-sodium). Regular canned tomato sauce and paste (not low-sodium or reduced-sodium). Regular tomato and vegetable juice (not low-sodium or reduced-sodium). Angie Fava. Olives. Fruits Canned fruit in a light or heavy syrup. Fried fruit. Fruit in cream or butter sauce. Meat and other protein foods Fatty cuts of meat. Ribs. Fried meat. Berniece Salines. Sausage. Bologna and other processed lunch meats. Salami. Fatback. Hotdogs.  Bratwurst. Salted nuts and seeds. Canned beans with added salt. Canned or smoked fish. Whole eggs or egg yolks. Chicken or [place] with skin. Dairy Whole or 2% milk, cream, and half-and-half. Whole or full-fat cream cheese. Whole-fat or sweetened yogurt. Full-fat cheese. Nondairy creamers. Whipped toppings. Processed cheese and cheese spreads. Fats and oils Butter. Stick margarine. Lard. Shortening. Ghee. Bacon fat. Tropical oils, such as coconut, palm kernel, or palm oil. Seasoning and other foods Salted popcorn and pretzels. Onion salt, garlic salt, seasoned salt, table salt, and sea salt. Worcestershire sauce. Tartar sauce. Barbecue sauce. Teriyaki sauce. Soy sauce, including reduced-sodium. Steak sauce. Canned and packaged gravies. Fish sauce. Oyster sauce. Cocktail sauce. Horseradish that you find on the shelf. Ketchup. Mustard. Meat flavorings and tenderizers. Bouillon cubes. Hot sauce and Tabasco sauce. Premade or packaged marinades. Premade or packaged taco seasonings. Relishes. Regular salad dressings. Where to find more information:  National Heart, Lung, and [place]: https://wilson-eaton.com/  American Heart Association: www.heart.org Summary  The DASH eating plan is a healthy eating plan that has been shown to reduce high blood pressure (hypertension). It may also reduce your risk for type 2 diabetes, heart disease, and stroke.  With the DASH eating plan, you should limit salt (sodium) intake to 2,300 mg a day. If you have hypertension, you may need to  reduce your sodium intake to 1,500 mg a day.  When on the DASH eating plan, aim to eat more fresh fruits and vegetables, whole grains, lean proteins, low-fat dairy, and heart-healthy fats.  Work with your health care provider or diet and nutrition specialist (dietitian) to adjust your eating plan to your individual calorie needs. This information is not intended to replace advice given to you by your health care provider. Make sure you  discuss any questions you have with your health care provider. Document Released: 05/17/2011 Document Revised: 05/21/2016 Document Reviewed: 05/21/2016 Elsevier Interactive Patient Education  [employer].

## 2018-05-03 LAB — BASIC METABOLIC PANEL
BUN/Creatinine Ratio: 19 (ref 12–28)
BUN: 12 mg/dL (ref 8–27)
CHLORIDE: 106 mmol/L (ref 96–106)
CO2: 20 mmol/L (ref 20–29)
Calcium: 9.2 mg/dL (ref 8.7–10.3)
Creatinine, Ser: 0.64 mg/dL (ref 0.57–1.00)
GFR, EST AFRICAN AMERICAN: 110 mL/min/{1.73_m2} (ref >59)
GFR, EST NON AFRICAN AMERICAN: 95 mL/min/{1.73_m2} (ref >59)
Glucose: 91 mg/dL (ref 65–99)
POTASSIUM: 4.2 mmol/L (ref 3.5–5.2)
SODIUM: 143 mmol/L (ref 134–144)

## 2018-06-10 ENCOUNTER — Ambulatory Visit: Payer: Managed Care, Other (non HMO) | Admitting: Family Medicine

## 2018-06-10 ENCOUNTER — Encounter: Payer: Self-pay | Admitting: Family Medicine

## 2018-06-10 VITALS — BP 152/100 | HR 93 | Temp 98.1°F | Ht 65.0 in | Wt 245.9 lb

## 2018-06-10 DIAGNOSIS — E7800 Pure hypercholesterolemia, unspecified: Secondary | ICD-10-CM | POA: Diagnosis not present

## 2018-06-10 DIAGNOSIS — Z1239 Encounter for other screening for malignant neoplasm of breast: Secondary | ICD-10-CM

## 2018-06-10 DIAGNOSIS — I10 Essential (primary) hypertension: Secondary | ICD-10-CM | POA: Insufficient documentation

## 2018-06-10 DIAGNOSIS — Z5181 Encounter for therapeutic drug level monitoring: Secondary | ICD-10-CM | POA: Diagnosis not present

## 2018-06-10 DIAGNOSIS — Z6841 Body Mass Index (BMI) 40.0 and over, adult: Secondary | ICD-10-CM | POA: Insufficient documentation

## 2018-06-10 LAB — LIPID PANEL
Cholesterol: 216 mg/dL — ABNORMAL HIGH (ref <200)
HDL: 52 mg/dL (ref >50)
LDL Cholesterol (Calc): 123 mg/dL (calc) — ABNORMAL HIGH
Non-HDL Cholesterol (Calc): 164 mg/dL (calc) — ABNORMAL HIGH (ref <130)
Total CHOL/HDL Ratio: 4.2 (calc) (ref <5.0)
Triglycerides: 264 mg/dL — ABNORMAL HIGH (ref <150)

## 2018-06-10 LAB — COMPLETE METABOLIC PANEL WITH GFR
AG Ratio: 1.5 (calc) (ref 1.0–2.5)
ALBUMIN MSPROF: 4.4 g/dL (ref 3.6–5.1)
ALT: 12 U/L (ref 6–29)
AST: 12 U/L (ref 10–35)
Alkaline phosphatase (APISO): 65 U/L (ref 33–130)
BUN: 16 mg/dL (ref 7–25)
CO2: 27 mmol/L (ref 20–32)
Calcium: 9.8 mg/dL (ref 8.6–10.4)
Chloride: 105 mmol/L (ref 98–110)
Creat: 0.67 mg/dL (ref 0.50–0.99)
GFR, EST NON AFRICAN AMERICAN: 94 mL/min/{1.73_m2} (ref >=60)
GFR, Est African American: 108 mL/min/{1.73_m2} (ref >=60)
GLOBULIN: 2.9 g/dL (ref 1.9–3.7)
Glucose, Bld: 100 mg/dL — ABNORMAL HIGH (ref 65–99)
Potassium: 5 mmol/L (ref 3.5–5.3)
Sodium: 142 mmol/L (ref 135–146)
Total Bilirubin: 0.5 mg/dL (ref 0.2–1.2)
Total Protein: 7.3 g/dL (ref 6.1–8.1)

## 2018-06-10 LAB — TSH: TSH: 2.18 mIU/L (ref 0.40–4.50)

## 2018-06-10 MED ORDER — LOSARTAN POTASSIUM 100 MG PO TABS
100.0000 mg | ORAL_TABLET | Freq: Every day | ORAL | 0 refills | Status: DC
Start: 2018-06-10 — End: 2018-10-10

## 2018-06-10 MED ORDER — CHLORTHALIDONE 25 MG PO TABS: 25.0000 mg | ORAL_TABLET | Freq: Every day | ORAL | 0 refills | Status: DC

## 2018-06-10 NOTE — Assessment & Plan Note (Signed)
Encouragement given She will work on this Open invitation for assistance if needed Limit sugary drinks

## 2018-06-10 NOTE — Assessment & Plan Note (Signed)
Increase the ARB to 100 mg daily; add chlorthalidone Return in 1 week to recheck DASH guidelines Weight loss encouraged

## 2018-06-10 NOTE — Progress Notes (Signed)
BP (!) 152/100   Pulse 93   Temp 98.1 F (36.7 C)   Ht 5\' 5"  (1.651 m)   Wt 245 lb 14.4 oz (111.5 kg)   SpO2 97%   BMI 40.92 kg/m    Subjective:    Patient ID: Pamela [place], female    DOB: [birth year], 62 y.o.   MRN: [id number]  HPI: Pamela [place] is a 62 y.o. female  Chief Complaint  Patient presents with  . New Patient (Initial Visit)    HPI Patient is here to establish care  High blood pressure for about a year Has been on losartan 50 mg for a year Her BP has been creeping up She is retired from [employer]; [place] [facility] someone in [place] to get Rx 140/90s there No excessive stress of late Does not add salt to food; does eat a lot of processed foods though HTN does not run in the family Cholesterol is borderline; does eat a lot fatty meats Retired a year ago, less active than more; "I should be more active"; she will start to be more active, no chest pain, will start slowly and gradually No sleep apnea Nonsmoker, no alcohol No chest pain, no headaches Obese   Depression screen [facility] 2/9 06/10/2018  Decreased Interest 0  Down, Depressed, Hopeless 0  PHQ - 2 Score 0  Altered sleeping 0  Tired, decreased energy 0  Change in appetite 0  Feeling bad or failure about yourself  0  Trouble concentrating 0  Moving slowly or fidgety/restless 0  Suicidal thoughts 0  PHQ-9 Score 0  Difficult doing work/chores Not difficult at all   Fall Risk  06/10/2018  Falls in the past year? 0    Relevant past medical, surgical, family and social history reviewed Past Medical History:  Diagnosis Date  . Hypertension    Past Surgical History:  Procedure Laterality Date  . COLONOSCOPY WITH PROPOFOL N/A 03/15/2017   Procedure: COLONOSCOPY WITH PROPOFOL;  Surgeon: Manya Silvas, MD;  Location: [facility] END[facility]COPY;  Service: Endoscopy;  Laterality: N/A;   Family History  Problem Relation Age of Onset  . Diabetes Father   . Breast cancer Neg Hx    Social  History   Tobacco Use  . Smoking status: Never Smoker  . Smokeless tobacco: Never Used  Substance Use Topics  . Alcohol use: No    Alcohol/week: 0.0 standard drinks  . Drug use: No     Office Visit from 06/10/2018 in [facility]  AUDIT-C Score  0      Interim medical history since last visit reviewed. Allergies and medications reviewed  Review of Systems Per HPI unless specifically indicated above     Objective:    BP (!) 152/100   Pulse 93   Temp 98.1 F (36.7 C)   Ht 5\' 5"  (1.651 m)   Wt 245 lb 14.4 oz (111.5 kg)   SpO2 97%   BMI 40.92 kg/m   Wt Readings from Last 3 Encounters:  06/10/18 245 lb 14.4 oz (111.5 kg)  05/02/18 235 lb (106.6 kg)  04/15/17 246 lb (111.6 kg)   MD note: the 235 pounds was an estimate; they did not weigh her  Physical Exam Constitutional:      General: She is not in acute distress.    Appearance: She is well-developed. She is obese. She is not diaphoretic.  HENT:     Head: Normocephalic and atraumatic.  Eyes:  General: No scleral icterus. Neck:     Thyroid: No thyromegaly.  Cardiovascular:     Rate and Rhythm: Normal rate and regular rhythm.     Heart sounds: Normal heart sounds. No murmur.  Pulmonary:     Effort: Pulmonary effort is normal. No respiratory distress.     Breath sounds: Normal breath sounds. No wheezing.  Abdominal:     General: Bowel sounds are normal. There is no distension.     Palpations: Abdomen is soft.  Musculoskeletal:     Right lower leg: No edema.     Left lower leg: No edema.  Skin:    General: Skin is warm and dry.     Coloration: Skin is not pale.  Neurological:     Mental Status: She is alert.  Psychiatric:        Behavior: Behavior normal.        Thought Content: Thought content normal.        Judgment: Judgment normal.       Assessment & Plan:   Problem List Items Addressed This Visit      Cardiovascular and Mediastinum   Essential hypertension, benign -  Primary    Increase the ARB to 100 mg daily; add chlorthalidone Return in 1 week to recheck DASH guidelines Weight loss encouraged      Relevant Medications   losartan (COZAAR) 100 MG tablet   chlorthalidone (HYGROTON) 25 MG tablet   Other Relevant Orders   TSH (Completed)   Lipid panel (Completed)     Other   Morbid obesity ([place])    Encouragement given She will work on this Open invitation for assistance if needed Limit sugary drinks      Relevant Orders   TSH (Completed)   Lipid panel (Completed)   High cholesterol    Limit fatty meats, limit egg yolks to no more than 3 per week; limit cheese Check lipids today      Relevant Medications   losartan (COZAAR) 100 MG tablet   chlorthalidone (HYGROTON) 25 MG tablet   Other Relevant Orders   Lipid panel (Completed)    Other Visit Diagnoses    Medication monitoring encounter       Relevant Orders   COMPLETE METABOLIC PANEL WITH GFR (Completed)   Screening for breast cancer       Relevant Orders   MM 3D SCREEN BREAST BILATERAL (Completed)       Follow up plan: Return in about 1 week (around 06/17/2018) for blood pressure recheck with CMA (free); notify me if BP >140/90.  An after-visit summary was printed and given to the patient at [place].  Please see the patient instructions which may contain other information and recommendations beyond what is mentioned above in the assessment and plan.  Meds ordered this encounter  Medications  . losartan (COZAAR) 100 MG tablet    Sig: Take 1 tablet (100 mg total) by mouth daily.    Dispense:  90 tablet    Refill:  0    Okay to leave on file until needed  . chlorthalidone (HYGROTON) 25 MG tablet    Sig: Take 1 tablet (25 mg total) by mouth daily.    Dispense:  90 tablet    Refill:  0    Orders Placed This Encounter  Procedures  . MM 3D SCREEN BREAST BILATERAL  . TSH  . Lipid panel  . COMPLETE METABOLIC PANEL WITH GFR

## 2018-06-10 NOTE — Patient Instructions (Addendum)
Limit or avoid foods from cows and pigs Try to limit saturated fats in your diet (bologna, hot dogs, barbeque, cheeseburgers, hamburgers, steak, bacon, sausage, cheese, etc.) and get more fresh fruits, vegetables, and whole grains  Try to follow the DASH guidelines (DASH stands for Dietary Approaches to Stop Hypertension). Try to limit the sodium in your diet to no more than 1,500mg  of sodium per day. Certainly try to not exceed 2,000 mg per day at the very most. Do not add salt when cooking or at the table.  Check the sodium amount on labels when shopping, and choose items lower in sodium when given a choice. Avoid or limit foods that already contain a lot of sodium. Eat a diet rich in fruits and vegetables and whole grains, and try to lose weight if overweight or obese   DASH Eating Plan DASH stands for "Dietary Approaches to Stop Hypertension." The DASH eating plan is a healthy eating plan that has been shown to reduce high blood pressure (hypertension). It may also reduce your risk for type 2 diabetes, heart disease, and stroke. The DASH eating plan may also help with weight loss. What are tips for following this plan?  General guidelines  Avoid eating more than 2,300 mg (milligrams) of salt (sodium) a day. If you have hypertension, you may need to reduce your sodium intake to 1,500 mg a day.  Limit alcohol intake to no more than 1 drink a day for nonpregnant women and 2 drinks a day for men. One drink equals 12 oz of beer, 5 oz of wine, or 1 oz of hard liquor.  Work with your health care provider to maintain a healthy body weight or to lose weight. Ask what an ideal weight is for you.  Get at least 30 minutes of exercise that causes your heart to beat faster (aerobic exercise) most days of the week. Activities may include walking, swimming, or biking.  Work with your health care provider or diet and nutrition specialist (dietitian) to adjust your eating plan to your individual calorie  needs. Reading food labels   Check food labels for the amount of sodium per serving. Choose foods with less than 5 percent of the Daily Value of sodium. Generally, foods with less than 300 mg of sodium per serving fit into this eating plan.  To find whole grains, look for the word "whole" as the first word in the ingredient list. Shopping  Buy products labeled as "low-sodium" or "no salt added."  Buy fresh foods. Avoid canned foods and premade or frozen meals. Cooking  Avoid adding salt when cooking. Use salt-free seasonings or herbs instead of table salt or sea salt. Check with your health care provider or pharmacist before using salt substitutes.  Do not fry foods. Cook foods using healthy methods such as baking, boiling, grilling, and broiling instead.  Cook with heart-healthy oils, such as olive, canola, soybean, or sunflower oil. Meal planning  Eat a balanced diet that includes: ? 5 or more servings of fruits and vegetables each day. At each meal, try to fill half of your plate with fruits and vegetables. ? Up to 6-8 servings of whole grains each day. ? Less than 6 oz of lean meat, poultry, or fish each day. A 3-oz serving of meat is about the same size as a deck of cards. One egg equals 1 oz. ? 2 servings of low-fat dairy each day. ? A serving of nuts, seeds, or beans 5 times each week. ? Heart-healthy  fats. Healthy fats called Omega-3 fatty acids are found in foods such as flaxseeds and coldwater fish, like sardines, salmon, and mackerel.  Limit how much you eat of the following: ? Canned or prepackaged foods. ? Food that is high in trans fat, such as fried foods. ? Food that is high in saturated fat, such as fatty meat. ? Sweets, desserts, sugary drinks, and other foods with added sugar. ? Full-fat dairy products.  Do not salt foods before eating.  Try to eat at least 2 vegetarian meals each week.  Eat more home-cooked food and less restaurant, buffet, and fast  food.  When eating at a restaurant, ask that your food be prepared with less salt or no salt, if possible. What foods are recommended? The items listed may not be a complete list. Talk with your dietitian about what dietary choices are best for you. Grains Whole-grain or whole-wheat bread. Whole-grain or whole-wheat pasta. Brown rice. [place]. Bulgur. Whole-grain and low-sodium cereals. Pita bread. Low-fat, low-sodium crackers. Whole-wheat flour tortillas. Vegetables Fresh or frozen vegetables (raw, steamed, roasted, or grilled). Low-sodium or reduced-sodium tomato and vegetable juice. Low-sodium or reduced-sodium tomato sauce and tomato paste. Low-sodium or reduced-sodium canned vegetables. Fruits All fresh, dried, or frozen fruit. Canned fruit in natural juice (without added sugar). Meat and other protein foods Skinless chicken or [place]. Ground chicken or [place]. Pork with fat trimmed off. Fish and seafood. Egg whites. Dried beans, peas, or lentils. Unsalted nuts, nut butters, and seeds. Unsalted canned beans. Lean cuts of beef with fat trimmed off. Low-sodium, lean deli meat. Dairy Low-fat (1%) or fat-free (skim) milk. Fat-free, low-fat, or reduced-fat cheeses. Nonfat, low-sodium ricotta or cottage cheese. Low-fat or nonfat yogurt. Low-fat, low-sodium cheese. Fats and oils Soft margarine without trans fats. Vegetable oil. Low-fat, reduced-fat, or light mayonnaise and salad dressings (reduced-sodium). Canola, safflower, olive, soybean, and sunflower oils. Avocado. Seasoning and other foods Herbs. Spices. Seasoning mixes without salt. Unsalted popcorn and pretzels. Fat-free sweets. What foods are not recommended? The items listed may not be a complete list. Talk with your dietitian about what dietary choices are best for you. Grains Baked goods made with fat, such as croissants, muffins, or some breads. Dry pasta or rice meal packs. Vegetables Creamed or fried vegetables. Vegetables  in a cheese sauce. Regular canned vegetables (not low-sodium or reduced-sodium). Regular canned tomato sauce and paste (not low-sodium or reduced-sodium). Regular tomato and vegetable juice (not low-sodium or reduced-sodium). Angie Fava. Olives. Fruits Canned fruit in a light or heavy syrup. Fried fruit. Fruit in cream or butter sauce. Meat and other protein foods Fatty cuts of meat. Ribs. Fried meat. Berniece Salines. Sausage. Bologna and other processed lunch meats. Salami. Fatback. Hotdogs. Bratwurst. Salted nuts and seeds. Canned beans with added salt. Canned or smoked fish. Whole eggs or egg yolks. Chicken or [place] with skin. Dairy Whole or 2% milk, cream, and half-and-half. Whole or full-fat cream cheese. Whole-fat or sweetened yogurt. Full-fat cheese. Nondairy creamers. Whipped toppings. Processed cheese and cheese spreads. Fats and oils Butter. Stick margarine. Lard. Shortening. Ghee. Bacon fat. Tropical oils, such as coconut, palm kernel, or palm oil. Seasoning and other foods Salted popcorn and pretzels. Onion salt, garlic salt, seasoned salt, table salt, and sea salt. Worcestershire sauce. Tartar sauce. Barbecue sauce. Teriyaki sauce. Soy sauce, including reduced-sodium. Steak sauce. Canned and packaged gravies. Fish sauce. Oyster sauce. Cocktail sauce. Horseradish that you find on the shelf. Ketchup. Mustard. Meat flavorings and tenderizers. Bouillon cubes. Hot sauce and Tabasco sauce. Premade  or packaged marinades. Premade or packaged taco seasonings. Relishes. Regular salad dressings. Where to find more information:  National Heart, Lung, and [place]: https://wilson-eaton.com/  American Heart Association: www.heart.org Summary  The DASH eating plan is a healthy eating plan that has been shown to reduce high blood pressure (hypertension). It may also reduce your risk for type 2 diabetes, heart disease, and stroke.  With the DASH eating plan, you should limit salt (sodium) intake to 2,300 mg a  day. If you have hypertension, you may need to reduce your sodium intake to 1,500 mg a day.  When on the DASH eating plan, aim to eat more fresh fruits and vegetables, whole grains, lean proteins, low-fat dairy, and heart-healthy fats.  Work with your health care provider or diet and nutrition specialist (dietitian) to adjust your eating plan to your individual calorie needs. This information is not intended to replace advice given to you by your health care provider. Make sure you discuss any questions you have with your health care provider. Document Released: 05/17/2011 Document Revised: 05/21/2016 Document Reviewed: 05/21/2016 Elsevier Interactive Patient Education  2019 Elsevier Inc.  Obesity, Adult Obesity is the condition of having too much total body fat. Being overweight or obese means that your weight is greater than what is considered healthy for your body size. Obesity is determined by a measurement called BMI. BMI is an estimate of body fat and is calculated from height and weight. For adults, a BMI of 30 or higher is considered obese. Obesity can eventually lead to other health concerns and major illnesses, including:  Stroke.  Coronary artery disease (CAD).  Type 2 diabetes.  Some types of cancer, including cancers of the colon, breast, uterus, and gallbladder.  Osteoarthritis.  High blood pressure (hypertension).  High cholesterol.  Sleep apnea.  Gallbladder stones.  Infertility problems. What are the causes? The main cause of obesity is taking in (consuming) more calories than your body uses for energy. Other factors that contribute to this condition may include:  Being born with genes that make you more likely to become obese.  Having a medical condition that causes obesity. These conditions include: ? Hypothyroidism. ? Polycystic ovarian syndrome (PCOS). ? Binge-eating disorder. ? Cushing syndrome.  Taking certain medicines, such as steroids,  antidepressants, and seizure medicines.  Not being physically active (sedentary lifestyle).  Living where there are limited places to exercise safely or buy healthy foods.  Not getting enough sleep. What increases the risk? The following factors may increase your risk of this condition:  Having a family history of obesity.  Being a woman of African-American descent.  Being a man of Hispanic descent. What are the signs or symptoms? Having excessive body fat is the main symptom of this condition. How is this diagnosed? This condition may be diagnosed based on:  Your symptoms.  Your medical history.  A physical exam. Your health care provider may measure: ? Your BMI. If you are an adult with a BMI between 25 and less than 30, you are considered overweight. If you are an adult with a BMI of 30 or higher, you are considered obese. ? The distances around your hips and your waist (circumferences). These may be compared to each other to help diagnose your condition. ? Your skinfold thickness. Your health care provider may gently pinch a fold of your skin and measure it. How is this treated? Treatment for this condition often includes changing your lifestyle. Treatment may include some or all of the following:  Dietary changes. Work with your health care provider and a dietitian to set a weight-loss goal that is healthy and reasonable for you. Dietary changes may include eating: ? Smaller portions. A portion size is the amount of a particular food that is healthy for you to eat at one time. This varies from person to person. ? Low-calorie or low-fat options. ? More whole grains, fruits, and vegetables.  Regular physical activity. This may include aerobic activity (cardio) and strength training.  Medicine to help you lose weight. Your health care provider may prescribe medicine if you are unable to lose 1 pound a week after 6 weeks of eating more healthily and doing more physical  activity.  Surgery. Surgical options may include gastric banding and gastric bypass. Surgery may be done if: ? Other treatments have not helped to improve your condition. ? You have a BMI of 40 or higher. ? You have life-threatening health problems related to obesity. Follow these instructions at home:  Eating and drinking   Follow recommendations from your health care provider about what you eat and drink. Your health care provider may advise you to: ? Limit fast foods, sweets, and processed snack foods. ? Choose low-fat options, such as low-fat milk instead of whole milk. ? Eat 5 or more servings of fruits or vegetables every day. ? Eat at home more often. This gives you more control over what you eat. ? Choose healthy foods when you eat out. ? Learn what a healthy portion size is. ? Keep low-fat snacks on hand. ? Avoid sugary drinks, such as soda, fruit juice, iced tea sweetened with sugar, and flavored milk. ? Eat a healthy breakfast.  Drink enough water to keep your urine clear or pale yellow.  Do not go without eating for long periods of time (do not fast) or follow a fad diet. Fasting and fad diets can be unhealthy and even dangerous. Physical Activity  Exercise regularly, as told by your health care provider. Ask your health care provider what types of exercise are safe for you and how often you should exercise.  Warm up and stretch before being active.  Cool down and stretch after being active.  Rest between periods of activity. Lifestyle  Limit the time that you spend in front of your TV, computer, or video game system.  Find ways to reward yourself that do not involve food.  Limit alcohol intake to no more than 1 drink a day for nonpregnant women and 2 drinks a day for men. One drink equals 12 oz of beer, 5 oz of wine, or 1 oz of hard liquor. General instructions  Keep a weight loss journal to keep track of the food you eat and how much you exercise you  get.  Take over-the-counter and prescription medicines only as told by your health care provider.  Take vitamins and supplements only as told by your health care provider.  Consider joining a support group. Your health care provider may be able to recommend a support group.  Keep all follow-up visits as told by your health care provider. This is important. Contact a health care provider if:  You are unable to meet your weight loss goal after 6 weeks of dietary and lifestyle changes. This information is not intended to replace advice given to you by your health care provider. Make sure you discuss any questions you have with your health care provider. Document Released: 07/05/2004 Document Revised: 10/31/2015 Document Reviewed: 03/16/2015 Elsevier Interactive Patient Education  2019 Elsevier Inc.  Preventing Unhealthy Weight Gain, Adult Staying at a healthy weight is important to your overall health. When fat builds up in your body, you may become overweight or obese. Being overweight or obese increases your risk of developing certain health problems, such as heart disease, diabetes, sleeping problems, joint problems, and some types of cancer. Unhealthy weight gain is often the result of making unhealthy food choices or not getting enough exercise. You can make changes to your lifestyle to prevent obesity and stay as healthy as possible. What nutrition changes can be made?   Eat only as much as your body needs. To do this: ? Pay attention to signs that you are hungry or full. Stop eating as soon as you feel full. ? If you feel hungry, try drinking water first before eating. Drink enough water so your urine is clear or pale yellow. ? Eat smaller portions. Pay attention to portion sizes when eating out. ? Look at serving sizes on food labels. Most foods contain more than one serving per container. ? Eat the recommended number of calories for your gender and activity level. For most active  people, a daily total of 2,000 calories is appropriate. If you are trying to lose weight or are not very active, you may need to eat fewer calories. Talk with your health care provider or a diet and nutrition specialist (dietitian) about how many calories you need each day.  Choose healthy foods, such as: ? Fruits and vegetables. At each meal, try to fill at least half of your plate with fruits and vegetables. ? Whole grains, such as whole-wheat bread, brown rice, and quinoa. ? Lean meats, such as chicken or fish. ? Other healthy proteins, such as beans, eggs, or tofu. ? Healthy fats, such as nuts, seeds, fatty fish, and olive oil. ? Low-fat or fat-free dairy products.  Check food labels, and avoid food and drinks that: ? Are high in calories. ? Have added sugar. ? Are high in sodium. ? Have saturated fats or trans fats.  Cook foods in healthier ways, such as by baking, broiling, or grilling.  Make a meal plan for the week, and shop with a grocery list to help you stay on track with your purchases. Try to avoid going to the grocery store when you are hungry.  When grocery shopping, try to shop around the outside of the store first, where the fresh foods are. Doing this helps you to avoid prepackaged foods, which can be high in sugar, salt (sodium), and fat. What lifestyle changes can be made?   Exercise for 30 or more minutes on 5 or more days each week. Exercising may include brisk walking, yard work, biking, running, swimming, and team sports like basketball and soccer. Ask your health care provider which exercises are safe for you.  Do muscle-strengthening activities, such as lifting weights or using resistance bands, on 2 or more days a week.  Do not use any products that contain nicotine or tobacco, such as cigarettes and e-cigarettes. If you need help quitting, ask your health care provider.  Limit alcohol intake to no more than 1 drink a day for nonpregnant women and 2 drinks a  day for men. One drink equals 12 oz of beer, 5 oz of wine, or 1 oz of hard liquor.  Try to get 7-9 hours of sleep each night. What other changes can be made?  Keep a food and activity journal to keep track of: ? What you  ate and how many calories you had. Remember to count the calories in sauces, dressings, and side dishes. ? Whether you were active, and what exercises you did. ? Your calorie, weight, and activity goals.  Check your weight regularly. Track any changes. If you notice you have gained weight, make changes to your diet or activity routine.  Avoid taking weight-loss medicines or supplements. Talk to your health care provider before starting any new medicine or supplement.  Talk to your health care provider before trying any new diet or exercise plan. Why are these changes important? Eating healthy, staying active, and having healthy habits can help you to prevent obesity. Those changes also:  Help you manage stress and emotions.  Help you connect with friends and family.  Improve your self-esteem.  Improve your sleep.  Prevent long-term health problems. What can happen if changes are not made? Being obese or overweight can cause you to develop joint or bone problems, which can make it hard for you to stay active or do activities you enjoy. Being obese or overweight also puts stress on your heart and lungs and can lead to health problems like diabetes, heart disease, and some cancers. Where to find more information Talk with your health care provider or a dietitian about healthy eating and healthy lifestyle choices. You may also find information from:  U.[place]. Department of Agriculture, MyPlate: FormerBoss.no  American Heart Association: www.heart.org  Centers for Disease Control and Prevention: http://www.wolf.info/ Summary  Staying at a healthy weight is important to your overall health. It helps you to prevent certain diseases and health problems, such as heart  disease, diabetes, joint problems, sleep disorders, and some types of cancer.  Being obese or overweight can cause you to develop joint or bone problems, which can make it hard for you to stay active or do activities you enjoy.  You can prevent unhealthy weight gain by eating a healthy diet, exercising regularly, not smoking, limiting alcohol, and getting enough sleep.  Talk with your health care provider or a dietitian for guidance about healthy eating and healthy lifestyle choices. This information is not intended to replace advice given to you by your health care provider. Make sure you discuss any questions you have with your health care provider. Document Released: 05/29/2016 Document Revised: 03/08/2017 Document Reviewed: 07/04/2016 Elsevier Interactive Patient Education  2019 [employer].

## 2018-06-10 NOTE — Assessment & Plan Note (Signed)
Limit fatty meats, limit egg yolks to no more than 3 per week; limit cheese Check lipids today

## 2018-06-12 ENCOUNTER — Other Ambulatory Visit: Payer: Self-pay | Admitting: Family Medicine

## 2018-06-12 MED ORDER — ASPIRIN 81 MG PO TBEC: 81.0000 mg | DELAYED_RELEASE_TABLET | Freq: Every day | ORAL | Status: DC

## 2018-06-16 ENCOUNTER — Ambulatory Visit
Admission: RE | Admit: 2018-06-16 | Discharge: 2018-06-16 | Disposition: A | Discharge status: Home / Self Care | Payer: Managed Care, Other (non HMO) | Source: Ambulatory Visit | Attending: Family Medicine | Admitting: Family Medicine

## 2018-06-16 DIAGNOSIS — Z1239 Encounter for other screening for malignant neoplasm of breast: Secondary | ICD-10-CM | POA: Diagnosis present

## 2018-06-17 ENCOUNTER — Ambulatory Visit: Payer: Managed Care, Other (non HMO)

## 2018-06-17 VITALS — BP 128/88 | HR 104

## 2018-06-17 DIAGNOSIS — I10 Essential (primary) hypertension: Secondary | ICD-10-CM

## 2018-06-17 NOTE — Addendum Note (Signed)
Addended by: Docia Furl on: 06/17/2018 09:11 AM   Modules accepted: Orders

## 2018-06-17 NOTE — Progress Notes (Signed)
Patient here for 1 week blood pressure check since starting new medication chlorthalidone 25mg .  She is also currently on Losartan 100mg  and denies any side effects.  Blood pressure today is 128/88 and pulse 104.  I consulted with Dr. Sanda Klein and we will discontinue chlorthalidone due to increased heart rate and start new med Toprol 25mg  and recheck BP and BMP in 1 week.

## 2018-06-18 MED ORDER — METOPROLOL SUCCINATE ER 25 MG PO TB24: 25.0000 mg | ORAL_TABLET | Freq: Every day | ORAL | 0 refills | Status: DC

## 2018-06-18 NOTE — Addendum Note (Signed)
Addended by: Docia Furl on: 06/18/2018 08:14 AM   Modules accepted: Orders

## 2018-06-24 ENCOUNTER — Ambulatory Visit: Payer: Managed Care, Other (non HMO)

## 2018-06-24 ENCOUNTER — Telehealth: Payer: Self-pay

## 2018-06-24 VITALS — BP 112/80 | HR 88

## 2018-06-24 DIAGNOSIS — Z5181 Encounter for therapeutic drug level monitoring: Secondary | ICD-10-CM

## 2018-06-24 DIAGNOSIS — I10 Essential (primary) hypertension: Secondary | ICD-10-CM

## 2018-06-24 LAB — BASIC METABOLIC PANEL WITH GFR
BUN: 18 mg/dL (ref 7–25)
CO2: 25 mmol/L (ref 20–32)
Calcium: 9.3 mg/dL (ref 8.6–10.4)
Chloride: 108 mmol/L (ref 98–110)
Creat: 0.77 mg/dL (ref 0.50–0.99)
GFR, Est African American: 95 mL/min/{1.73_m2} (ref >=60)
GFR, Est Non African American: 82 mL/min/{1.73_m2} (ref >=60)
Glucose, Bld: 100 mg/dL (ref 65–139)
Potassium: 5.1 mmol/L (ref 3.5–5.3)
Sodium: 141 mmol/L (ref 135–146)

## 2018-06-24 NOTE — Progress Notes (Signed)
Patient here for 1 week follow-up on blood pressure.  At last visit chlorthalidone was discontinued because of heart rate. So she was started on Toprol 25mg  qd and also currently on Losartan 100mg  qd.  Patient denies any side effects.  Blood pressure today is 112/80 and pulse 88.  We will get BMP today.  Consulted with Dr. Sanda Klein and we will continue current regimen.

## 2018-06-25 NOTE — Telephone Encounter (Signed)
erro  neous encounter

## 2018-06-25 NOTE — Progress Notes (Signed)
Greetings. Your labs look great. Peace, Dr. Sanda Klein

## 2018-07-11 ENCOUNTER — Other Ambulatory Visit: Payer: Self-pay | Admitting: Family Medicine

## 2018-07-11 NOTE — Telephone Encounter (Signed)
Copied from [facility]. Topic: Quick Communication - Rx Refill/Question >> Jul 11, 2018 10:59 AM Leward Quan A wrote: Medication: losartan (COZAAR) 100 MG tablet  Per patient only have 1 pill left.  Has the patient contacted their pharmacy? Yes.   (Agent: If no, request that the patient contact the pharmacy for the refill.) (Agent: If yes, when and what did the pharmacy advise?)  Preferred Pharmacy (with phone number or street name): [id number] [address], [place] [phone] (Phone) [phone] (Fax)    Agent: Please be advised that RX refills may take up to 3 business days. We ask that you follow-up with your pharmacy.

## 2018-07-11 NOTE — Telephone Encounter (Signed)
Reviewed last bp and pulse rx approved

## 2018-08-07 ENCOUNTER — Other Ambulatory Visit: Payer: Self-pay | Admitting: Family Medicine

## 2018-08-07 NOTE — Telephone Encounter (Signed)
Metoprolol succ requested I just prescribed a 3 month supply (30+2 RF) on 07/11/2018 Too soon for more refills from me Please call pharmacy and resolve

## 2018-08-08 NOTE — Telephone Encounter (Signed)
Resolved with pharmacy

## 2018-09-06 ENCOUNTER — Other Ambulatory Visit: Payer: Self-pay | Admitting: Family Medicine

## 2018-09-16 ENCOUNTER — Other Ambulatory Visit: Payer: Self-pay | Admitting: Family Medicine

## 2018-10-10 ENCOUNTER — Other Ambulatory Visit: Payer: Self-pay | Admitting: Family Medicine

## 2018-10-10 NOTE — Telephone Encounter (Signed)
done

## 2018-10-10 NOTE — Telephone Encounter (Signed)
Needs appointment; can be virtual if she has blood pressure cuff at home.

## 2018-10-20 NOTE — Progress Notes (Signed)
Virtual Visit via Video Note  I connected with Pamela Dixon on 10/21/18 at  8:20 AM EDT by a video enabled telemedicine application and verified that I am speaking with the correct person using two identifiers.   Staff discussed the limitations of evaluation and management by telemedicine and the availability of in person appointments. The patient expressed understanding and agreed to proceed.  Patient location: home  My location: work  office Other people present:  none HPI  Essential hypertension rx losartan 100mg  daily and metoprolol 25 mg daily, no missed doses States she feels her blood pressure cuff is not working correctly.  Denies headaches, blurry vision, chest pain,  States has probably had more sodium in diet, states low stress levels.  States feels like she has no energy after starting the metoprolol  BP Readings from Last 3 Encounters:  10/21/18 (!) 146/84  06/24/18 112/80  06/17/18 128/88    2. High cholesterol Taking coq10, has not been on anything else. States has not changed her diet.  Lab Results  Component Value Date   CHOL 216 (H) 06/10/2018   HDL 52 06/10/2018   LDLCALC 123 (H) 06/10/2018   TRIG 264 (H) 06/10/2018   CHOLHDL 4.2 06/10/2018    3. Morbid obesity ([place]) States she has gained some weight in the past few months she thinks she has gained 10 pounds. States she doesn't have as much energy and she has not been as active. Endorses bilateral knee pain- states feels fine when she is walking but at night feels like they get swollen and aching.    PHQ2/9: Depression screen [facility] 2/9 10/21/2018 06/10/2018  Decreased Interest 0 0  Down, Depressed, Hopeless 0 0  PHQ - 2 Score 0 0  Altered sleeping 0 0  Tired, decreased energy 0 0  Change in appetite 0 0  Feeling bad or failure about yourself  0 0  Trouble concentrating 0 0  Moving slowly or fidgety/restless 0 0  Suicidal thoughts 0 0  PHQ-9 Score 0 0  Difficult doing work/chores Not difficult at all  Not difficult at all     PHQ reviewed. Negative  Patient Active Problem List   Diagnosis Date Noted  . Essential hypertension, benign 06/10/2018  . Morbid obesity ([place]) 06/10/2018  . High cholesterol 06/10/2018    Past Medical History:  Diagnosis Date  . Hypertension     Past Surgical History:  Procedure Laterality Date  . COLONOSCOPY WITH PROPOFOL N/A 03/15/2017   Procedure: COLONOSCOPY WITH PROPOFOL;  Surgeon: Manya Silvas, MD;  Location: [facility] ENDOSCOPY;  Service: Endoscopy;  Laterality: N/A;    Social History   Tobacco Use  . Smoking status: Never Smoker  . Smokeless tobacco: Never Used  Substance Use Topics  . Alcohol use: No    Alcohol/week: 0.0 standard drinks     Current Outpatient Medications:  .  aspirin (BAYER ASPIRIN EC LOW DOSE) 81 MG EC tablet, Take 1 tablet (81 mg total) by mouth daily., Disp: , Rfl:  .  Cholecalciferol (VITAMIN D) 50 MCG (2000 UT) CAPS, , Disp: , Rfl:  .  Coenzyme Q10-Vitamin E (QUNOL ULTRA COQ10 PO), , Disp: , Rfl:  .  losartan (COZAAR) 100 MG tablet, TAKE 1 TABLET BY MOUTH EVERY DAY, Disp: 90 tablet, Rfl: 0 .  metoprolol succinate (TOPROL-XL) 25 MG 24 hr tablet, TAKE 1 TABLET BY MOUTH EVERY DAY, Disp: 30 tablet, Rfl: 2  No Known Allergies  ROS   No other specific complaints in a  complete review of systems (except as listed in HPI above).  Objective  Vitals:   10/21/18 0800 10/21/18 0837 10/21/18 1014  BP: (!) 164/92 (!) 154/89 (!) 146/84  Pulse: 88  97  Resp:   12  Temp:   98 F (36.7 C)  TempSrc:   Oral  SpO2:   95%  Weight: 253 lb (114.8 kg)  254 lb 9.6 oz (115.5 kg)  Height: 5\' 6"  (1.676 m)  5\' 6"  (1.676 m)    Body mass index is 41.09 kg/m.  Nursing Note and Vital Signs reviewed.  Physical Exam  Constitutional: Patient appears well-developed and well-nourished. No distress.  HENT: Head: Normocephalic and atraumatic. Cardiovascular: Normal rate Pulmonary/Chest: Effort normal  Musculoskeletal: Normal  range of motion in knees but notes anterior pain  Neurological: alert and oriented, speech normal.  Skin: No rash noted. No erythema.  Psychiatric: Patient has a normal mood and affect. behavior is normal. Judgment and thought content normal.    Assessment & Plan  1. Essential hypertension, benign Elevated, thinks her blood pressure cuff is not accurate, Will come by office to get vitals and labs. Will work on sodium reduction and follow up in one week.  - COMPLETE METABOLIC PANEL WITH GFR  2. High cholesterol Discussed diet, open to medications, will have recheck today and start on moderate-high intensity statin based off results.  - Lipid Profile  3. Morbid obesity ([place]) Discussed diet and activity   4. Chronic pain of both knees Given stretches, will work on slowly increasing activity and reducing calories for weight loss. Elevate at night, PRN aceteaminophen  5. Other fatigue May be related to addition of BB with cut it in half and take BID for same daily dose and see if she has improvement of fatigue, will consider other causes of fatigue.  - CBC w/Diff/Platelet - COMPLETE METABOLIC PANEL WITH GFR - TSH      Follow Up Instructions:   1 week for fatigue and 3 months for routine  I discussed the assessment and treatment plan with the patient. The patient was provided an opportunity to ask questions and all were answered. The patient agreed with the plan and demonstrated an understanding of the instructions.   The patient was advised to call back or seek an in-person evaluation if the symptoms worsen or if the condition fails to improve as anticipated.  I provided 23 minutes of non-face-to-face time during this encounter.   Fredderick Severance, NP

## 2018-10-21 ENCOUNTER — Encounter: Payer: Self-pay | Admitting: Nurse Practitioner

## 2018-10-21 ENCOUNTER — Other Ambulatory Visit: Payer: Self-pay

## 2018-10-21 ENCOUNTER — Ambulatory Visit (INDEPENDENT_AMBULATORY_CARE_PROVIDER_SITE_OTHER): Payer: Managed Care, Other (non HMO) | Admitting: Nurse Practitioner

## 2018-10-21 VITALS — BP 146/84 | HR 97 | Temp 98.0°F | Resp 12 | Ht 66.0 in | Wt 254.6 lb

## 2018-10-21 DIAGNOSIS — M25561 Pain in right knee: Secondary | ICD-10-CM

## 2018-10-21 DIAGNOSIS — I10 Essential (primary) hypertension: Secondary | ICD-10-CM | POA: Diagnosis not present

## 2018-10-21 DIAGNOSIS — R5383 Other fatigue: Secondary | ICD-10-CM

## 2018-10-21 DIAGNOSIS — G8929 Other chronic pain: Secondary | ICD-10-CM

## 2018-10-21 DIAGNOSIS — E7800 Pure hypercholesterolemia, unspecified: Secondary | ICD-10-CM

## 2018-10-21 DIAGNOSIS — M25562 Pain in left knee: Secondary | ICD-10-CM

## 2018-10-21 NOTE — Patient Instructions (Signed)
Youtube video with some stretches you can try:  [email or web address]

## 2018-10-22 ENCOUNTER — Other Ambulatory Visit: Payer: Self-pay | Admitting: Nurse Practitioner

## 2018-10-22 DIAGNOSIS — E782 Mixed hyperlipidemia: Secondary | ICD-10-CM

## 2018-10-22 LAB — CBC WITH DIFFERENTIAL/PLATELET
Absolute Monocytes: 507 cells/uL (ref 200–950)
Basophils Absolute: 71 cells/uL (ref 0–200)
Basophils Relative: 1.2 %
Eosinophils Absolute: 171 cells/uL (ref 15–500)
Eosinophils Relative: 2.9 %
HCT: 45.1 % — ABNORMAL HIGH (ref 35.0–45.0)
Hemoglobin: 15.2 g/dL (ref 11.7–15.5)
Lymphs Abs: 2879 cells/uL (ref 850–3900)
MCH: 30.6 pg (ref 27.0–33.0)
MCHC: 33.7 g/dL (ref 32.0–36.0)
MCV: 90.9 fL (ref 80.0–100.0)
MPV: 9.1 fL (ref 7.5–12.5)
Monocytes Relative: 8.6 %
Neutro Abs: 2272 cells/uL (ref 1500–7800)
Neutrophils Relative %: 38.5 %
Platelets: 303 10*3/uL (ref 140–400)
RBC: 4.96 10*6/uL (ref 3.80–5.10)
RDW: 11.7 % (ref 11.0–15.0)
Total Lymphocyte: 48.8 %
WBC: 5.9 10*3/uL (ref 3.8–10.8)

## 2018-10-22 LAB — COMPLETE METABOLIC PANEL WITH GFR
AG Ratio: 1.7 (calc) (ref 1.0–2.5)
ALT: 11 U/L (ref 6–29)
AST: 12 U/L (ref 10–35)
Albumin: 4.3 g/dL (ref 3.6–5.1)
Alkaline phosphatase (APISO): 53 U/L (ref 37–153)
BUN: 14 mg/dL (ref 7–25)
CO2: 26 mmol/L (ref 20–32)
Calcium: 9.3 mg/dL (ref 8.6–10.4)
Chloride: 106 mmol/L (ref 98–110)
Creat: 0.76 mg/dL (ref 0.50–0.99)
GFR, Est African American: 96 mL/min/{1.73_m2} (ref >=60)
GFR, Est Non African American: 83 mL/min/{1.73_m2} (ref >=60)
Globulin: 2.6 g/dL (calc) (ref 1.9–3.7)
Glucose, Bld: 101 mg/dL — ABNORMAL HIGH (ref 65–99)
Potassium: 3.9 mmol/L (ref 3.5–5.3)
Sodium: 141 mmol/L (ref 135–146)
Total Bilirubin: 0.5 mg/dL (ref 0.2–1.2)
Total Protein: 6.9 g/dL (ref 6.1–8.1)

## 2018-10-22 LAB — LIPID PANEL
Cholesterol: 205 mg/dL — ABNORMAL HIGH (ref <200)
HDL: 48 mg/dL — ABNORMAL LOW (ref >=50)
LDL Cholesterol (Calc): 124 mg/dL (calc) — ABNORMAL HIGH
Non-HDL Cholesterol (Calc): 157 mg/dL (calc) — ABNORMAL HIGH (ref <130)
Total CHOL/HDL Ratio: 4.3 (calc) (ref <5.0)
Triglycerides: 211 mg/dL — ABNORMAL HIGH (ref <150)

## 2018-10-22 LAB — TSH: TSH: 2.46 mIU/L (ref 0.40–4.50)

## 2018-10-22 MED ORDER — ATORVASTATIN CALCIUM 20 MG PO TABS: ORAL_TABLET | ORAL | 0 refills | Status: DC

## 2018-10-28 ENCOUNTER — Ambulatory Visit: Payer: Managed Care, Other (non HMO)

## 2018-10-28 ENCOUNTER — Other Ambulatory Visit: Payer: Self-pay

## 2018-10-28 VITALS — BP 148/90 | HR 82

## 2018-10-28 DIAGNOSIS — I10 Essential (primary) hypertension: Secondary | ICD-10-CM

## 2018-10-28 NOTE — Progress Notes (Signed)
No changes at this time.  Work on [employer] and routine exercise to bring her BP down just a bit more.

## 2018-10-28 NOTE — Progress Notes (Signed)
Patient here for blood pressure check.  Patient denies any side effects and is still on current meds listed in chart.  Blood pressure today was 148/90 and pulse 82.  Patient was told would have provider review and would let her know of any changes.

## 2018-11-25 ENCOUNTER — Telehealth: Payer: Self-pay

## 2018-11-25 ENCOUNTER — Encounter: Payer: Self-pay | Admitting: Family Medicine

## 2018-11-25 NOTE — Telephone Encounter (Signed)
Copied from [place] [id number]. Topic: General - Other >> Nov 24, 2018  8:35 AM Leward Quan A wrote: Reason for CRM: Patient called to say that the exercise did not work and the pain in her knees have gotten worse. Asking what is the next step. Please advise Ph# [phone]

## 2018-11-25 NOTE — Telephone Encounter (Signed)
See my chart message

## 2018-11-26 ENCOUNTER — Telehealth: Payer: Self-pay

## 2018-11-26 NOTE — Telephone Encounter (Signed)
Pt.notified

## 2018-11-26 NOTE — Telephone Encounter (Signed)
Copied from [place] [id number]. Topic: General - Other >> Nov 24, 2018  8:35 AM Pamela Dixon A wrote: Reason for CRM: Patient called to say that the exercise did not work and the pain in her knees have gotten worse. Asking what is the next step. Please advise Ph# [phone]

## 2018-11-26 NOTE — Telephone Encounter (Signed)
Im so sorry to hear it has not been improving.  You can utilize ice/heat 20 minutes at a time as needed to help with pain. Choose whichever one provider more relief and sometimes both can be helpful and different times such as heat for stiffness and ice for pain. Make sure you protect your skin when using these modalities. Your kidney function is good and you do not have any concerning risk factors that prevent you from taking an NSAID like aleve. I would recommend taking 1 tablet every 8 hours only as needed for severe pain. Make sure you take it with food. If you have mild to moderate pain and stiffness use acetaminophen 350-650mg  every 8 hours. You can take both acetaminophen and ibuprofen together. If your pain is not improving with this regimen or you develop any new or concerning symptoms please set up a virtual visit so we can see how we can best help you

## 2018-12-10 ENCOUNTER — Other Ambulatory Visit: Payer: Self-pay | Admitting: Nurse Practitioner

## 2019-01-03 ENCOUNTER — Other Ambulatory Visit: Payer: Self-pay | Admitting: Nurse Practitioner

## 2019-01-08 ENCOUNTER — Other Ambulatory Visit: Payer: Self-pay | Admitting: Nurse Practitioner

## 2019-01-08 DIAGNOSIS — E782 Mixed hyperlipidemia: Secondary | ICD-10-CM

## 2019-01-21 ENCOUNTER — Ambulatory Visit (INDEPENDENT_AMBULATORY_CARE_PROVIDER_SITE_OTHER): Payer: Managed Care, Other (non HMO) | Admitting: Nurse Practitioner

## 2019-01-21 ENCOUNTER — Encounter: Payer: Self-pay | Admitting: Nurse Practitioner

## 2019-01-21 VITALS — BP 145/88 | HR 88 | Resp 16

## 2019-01-21 DIAGNOSIS — I10 Essential (primary) hypertension: Secondary | ICD-10-CM

## 2019-01-21 DIAGNOSIS — E782 Mixed hyperlipidemia: Secondary | ICD-10-CM | POA: Diagnosis not present

## 2019-01-21 DIAGNOSIS — M25561 Pain in right knee: Secondary | ICD-10-CM | POA: Diagnosis not present

## 2019-01-21 DIAGNOSIS — G8929 Other chronic pain: Secondary | ICD-10-CM

## 2019-01-21 DIAGNOSIS — M25562 Pain in left knee: Secondary | ICD-10-CM

## 2019-01-21 DIAGNOSIS — Z5181 Encounter for therapeutic drug level monitoring: Secondary | ICD-10-CM

## 2019-01-21 MED ORDER — ATORVASTATIN CALCIUM 20 MG PO TABS: ORAL_TABLET | ORAL | 0 refills | Status: DC

## 2019-01-21 MED ORDER — HYDROCHLOROTHIAZIDE 12.5 MG PO CAPS: 12.5000 mg | ORAL_CAPSULE | Freq: Every day | ORAL | 3 refills | Status: DC

## 2019-01-21 NOTE — Progress Notes (Signed)
Virtual Visit via Video Note  I connected with Pamela Dixon on 01/21/19 at 11:20 AM EDT by a video enabled telemedicine application and verified that I am speaking with the correct person using two identifiers.   Staff discussed the limitations of evaluation and management by telemedicine and the availability of in person appointments. The patient expressed understanding and agreed to proceed.  Patient location: home  My location: work office Other people present: none HPI  Essential hypertension rx losartan 100mg  daily and metoprolol 25 mg daily, no missed doses States she feels her blood pressure cuff is not working correctly.  Denies headaches, blurry vision, chest pain,  Blood pressure has been 140's to 80's.  States has probably had more sodium in diet, states low stress levels.  Drinks about 4 glasses of pepsi a day. Drinks 1 bottles    High cholesterol Taking coq10, and started on lipitor 3 days a week without issue.  Eats fried foods 3-4 times a week; vegetables 5 times week- lots of squash, tomatoes,.  Lab Results  Component Value Date   CHOL 205 (H) 10/21/2018   HDL 48 (L) 10/21/2018   LDLCALC 124 (H) 10/21/2018   TRIG 211 (H) 10/21/2018   CHOLHDL 4.3 10/21/2018  The 10-year ASCVD risk score Mikey Bussing DC Jr., et al., 2013) is: 9.2%   Values used to calculate the score:     Age: 64 years     Sex: Female     Is Non-Hispanic African American: No     Diabetic: No     Tobacco smoker: No     Systolic Blood Pressure: 270 mmHg     Is BP treated: Yes     HDL Cholesterol: 48 mg/dL     Total Cholesterol: 205 mg/dL   Morbid obesity  States she has gained about 10 pounds from march to may but weight has been stable since then. No increased activity of diet change but states she is motivated to make some changes now. Bilateral knee pain still present, occassionally notices some stiffness and swelling. Worse if she is sitting or standing for long times.    PHQ2/9: Depression  screen [facility] 2/9 01/21/2019 10/21/2018 06/10/2018  Decreased Interest 0 0 0  Down, Depressed, Hopeless 0 0 0  PHQ - 2 Score 0 0 0  Altered sleeping 0 0 0  Tired, decreased energy 0 0 0  Change in appetite 0 0 0  Feeling bad or failure about yourself  0 0 0  Trouble concentrating 0 0 0  Moving slowly or fidgety/restless 0 0 0  Suicidal thoughts 0 0 0  PHQ-9 Score 0 0 0  Difficult doing work/chores Not difficult at all Not difficult at all Not difficult at all     PHQ reviewed. Negative  Patient Active Problem List   Diagnosis Date Noted  . Essential hypertension, benign 06/10/2018  . Morbid obesity ([place]) 06/10/2018  . High cholesterol 06/10/2018    Past Medical History:  Diagnosis Date  . Hypertension     Past Surgical History:  Procedure Laterality Date  . COLONOSCOPY WITH PROPOFOL N/A 03/15/2017   Procedure: COLONOSCOPY WITH PROPOFOL;  Surgeon: Manya Silvas, MD;  Location: [facility] ENDOSCOPY;  Service: Endoscopy;  Laterality: N/A;    Social History   Tobacco Use  . Smoking status: Never Smoker  . Smokeless tobacco: Never Used  Substance Use Topics  . Alcohol use: No    Alcohol/week: 0.0 standard drinks     Current Outpatient Medications:  .  aspirin (BAYER ASPIRIN EC LOW DOSE) 81 MG EC tablet, Take 1 tablet (81 mg total) by mouth daily., Disp: , Rfl:  .  atorvastatin (LIPITOR) 20 MG tablet, TAKE 1 TABLET ON MONDAY, WEDNESDAY, FRIDAYS., Disp: 36 tablet, Rfl: 0 .  Cholecalciferol (VITAMIN D) 50 MCG (2000 UT) CAPS, , Disp: , Rfl:  .  Coenzyme Q10-Vitamin E (QUNOL ULTRA COQ10 PO), , Disp: , Rfl:  .  losartan (COZAAR) 100 MG tablet, TAKE 1 TABLET BY MOUTH EVERY DAY, Disp: 90 tablet, Rfl: 0 .  metoprolol succinate (TOPROL-XL) 25 MG 24 hr tablet, TAKE 1 TABLET BY MOUTH EVERY DAY, Disp: 90 tablet, Rfl: 0  No Known Allergies  ROS   No other specific complaints in a complete review of systems (except as listed in HPI above).  Objective  Vitals:   01/21/19 1125   BP: (!) 145/88  Pulse: 88  Resp: 16    There is no height or weight on file to calculate BMI.  Nursing Note and Vital Signs reviewed.  Physical Exam  Constitutional: Patient appears well-developed and well-nourished. No distress.  HENT: Head: Normocephalic and atraumatic. Cardiovascular: Normal rate Pulmonary/Chest: Effort normal  Musculoskeletal: Normal range of motion,  Neurological: alert and oriented, speech normal.  Skin: No rash noted. No erythema.  Psychiatric: Patient has a normal mood and affect. behavior is normal. Judgment and thought content normal.    Assessment & Plan  1. Mixed hyperlipidemia - atorvastatin (LIPITOR) 20 MG tablet; TAKE 1 TABLET ON MONDAY, WEDNESDAY, FRIDAYS.  Dispense: 36 tablet; Refill: 0 - Lipid Profile  2. Essential hypertension, benign Elevated discussed DASH  - hydrochlorothiazide (MICROZIDE) 12.5 MG capsule; Take 1 capsule (12.5 mg total) by mouth daily.  Dispense: 30 capsule; Refill: 3  3. Morbid obesity ([place]) Will increase water, decrease sodas and portion sizes.   4. Chronic pain of both knees Weight loss  5. Medication monitoring encounter - Hepatic function panel    Follow Up Instructions:  1 month BP 4 months routine.   I discussed the assessment and treatment plan with the patient. The patient was provided an opportunity to ask questions and all were answered. The patient agreed with the plan and demonstrated an understanding of the instructions.   The patient was advised to call back or seek an in-person evaluation if the symptoms worsen or if the condition fails to improve as anticipated.  I provided 15 minutes of non-face-to-face time during this encounter.   Fredderick Severance, NP

## 2019-03-09 ENCOUNTER — Other Ambulatory Visit: Payer: Self-pay

## 2019-03-09 MED ORDER — METOPROLOL SUCCINATE ER 25 MG PO TB24: 25.0000 mg | ORAL_TABLET | Freq: Every day | ORAL | 1 refills | Status: DC

## 2019-04-08 ENCOUNTER — Other Ambulatory Visit: Payer: Self-pay

## 2019-04-08 DIAGNOSIS — E782 Mixed hyperlipidemia: Secondary | ICD-10-CM

## 2019-04-08 MED ORDER — ATORVASTATIN CALCIUM 20 MG PO TABS: ORAL_TABLET | ORAL | 0 refills | Status: DC

## 2019-04-08 NOTE — Telephone Encounter (Signed)
Please schedule patient for follow up in the next 30 days.  

## 2019-04-08 NOTE — Telephone Encounter (Signed)
appt scheduled for 11.10.2020

## 2019-04-14 ENCOUNTER — Other Ambulatory Visit: Payer: Self-pay

## 2019-04-14 DIAGNOSIS — I10 Essential (primary) hypertension: Secondary | ICD-10-CM

## 2019-04-15 ENCOUNTER — Other Ambulatory Visit: Payer: Self-pay

## 2019-04-15 MED ORDER — HYDROCHLOROTHIAZIDE 12.5 MG PO CAPS: 12.5000 mg | ORAL_CAPSULE | Freq: Every day | ORAL | 0 refills | Status: DC

## 2019-04-16 MED ORDER — LOSARTAN POTASSIUM 100 MG PO TABS: 100.0000 mg | ORAL_TABLET | Freq: Every day | ORAL | 1 refills | Status: DC

## 2019-04-21 ENCOUNTER — Other Ambulatory Visit: Payer: Self-pay

## 2019-04-21 ENCOUNTER — Encounter: Payer: Self-pay | Admitting: Family Medicine

## 2019-04-21 ENCOUNTER — Ambulatory Visit (INDEPENDENT_AMBULATORY_CARE_PROVIDER_SITE_OTHER): Payer: Managed Care, Other (non HMO) | Admitting: Family Medicine

## 2019-04-21 VITALS — BP 151/88

## 2019-04-21 DIAGNOSIS — I10 Essential (primary) hypertension: Secondary | ICD-10-CM

## 2019-04-21 DIAGNOSIS — E782 Mixed hyperlipidemia: Secondary | ICD-10-CM

## 2019-04-21 MED ORDER — HYDROCHLOROTHIAZIDE 25 MG PO TABS: 25.0000 mg | ORAL_TABLET | Freq: Every day | ORAL | 1 refills | Status: DC

## 2019-04-21 NOTE — Progress Notes (Signed)
Name: Pamela Dixon   MRN: [id number]    DOB: [birth year]   Date:04/21/2019       Progress Note  Subjective  Chief Complaint  Chief Complaint  Patient presents with  . Hypertension    medication refills  . Hyperlipidemia    I connected with  Daryel November on 04/21/19 at  8:40 AM EST by telephone and verified that I am speaking with the correct person using two identifiers.  I discussed the limitations, risks, security and privacy concerns of performing an evaluation and management service by telephone and the availability of in person appointments. Staff also discussed with the patient that there may be a patient responsible charge related to this service. Patient Location: Home  Provider Location: Office Additional Individuals present: None  HPI  Essential hypertension rx losartan 100mg  daily and metoprolol 25 mg daily, along with 12.5mg  HCTZ with no missed doses.  BP is elevated today at home, she has gained some weight and is less active since COVID-19 pandemic.  She states she does eat a lot of fried foods and drinks several caffeinated sodas daily. Denies headaches, blurry vision, chest pain, shortness of breath; trace BLE edema that is dependent and goes away at night with elevation.  We will increase her HCTZ and have her take metoprolol at night.  BP Readings from Last 3 Encounters:  04/21/19 (!) 151/88  01/21/19 (!) 145/88  10/28/18 (!) 148/90   High cholesterol Taking co-q10, and started on lipitor 3 days a week without issue. Denies chest pain or myalgias. Compliant with regimen. Due for labs.  Morbid obesity  States she has gained about 10 pounds from march to may but weight has been stable since then. No increased activity or diet change.  Bilateral knee pain still present, occassionally notices some stiffness and swelling. Discussed avoiding fried/fatty foods and increasing walks to several times a week.  Patient Active Problem List   Diagnosis Date Noted  .  Essential hypertension, benign 06/10/2018  . Morbid obesity ([place]) 06/10/2018  . High cholesterol 06/10/2018    Past Surgical History:  Procedure Laterality Date  . COLONOSCOPY WITH PROPOFOL N/A 03/15/2017   Procedure: COLONOSCOPY WITH PROPOFOL;  Surgeon: Manya Silvas, MD;  Location: [facility] ENDOSCOPY;  Service: Endoscopy;  Laterality: N/A;    Family History  Problem Relation Age of Onset  . Diabetes Father   . Breast cancer Neg Hx     Social History   Socioeconomic History  . Marital status: Married    Spouse name: Broadus John  . Number of children: 2  . Years of education: 33  . Highest education level: High school graduate  Occupational History  . Not on file  Social Needs  . Financial resource strain: Not hard at all  . Food insecurity    Worry: Never true    Inability: Never true  . Transportation needs    Medical: No    Non-medical: No  Tobacco Use  . Smoking status: Never Smoker  . Smokeless tobacco: Never Used  Substance and Sexual Activity  . Alcohol use: No    Alcohol/week: 0.0 standard drinks  . Drug use: No  . Sexual activity: Not on file  Lifestyle  . Physical activity    Days per week: 3 days    Minutes per session: 30 min  . Stress: Not at all  Relationships  . Social connections    Talks on phone: More than three times a week    Gets  together: Twice a week    Attends religious service: More than 4 times per year    Active member of club or organization: No    Attends meetings of clubs or organizations: Never    Relationship status: Married  . Intimate partner violence    Fear of current or ex partner: No    Emotionally abused: No    Physically abused: No    Forced sexual activity: No  Other Topics Concern  . Not on file  Social History Narrative  . Not on file     Current Outpatient Medications:  .  aspirin (BAYER ASPIRIN EC LOW DOSE) 81 MG EC tablet, Take 1 tablet (81 mg total) by mouth daily., Disp: , Rfl:  .  atorvastatin (LIPITOR)  20 MG tablet, TAKE 1 TABLET ON MONDAY, WEDNESDAY, FRIDAYS., Disp: 10 tablet, Rfl: 0 .  Cholecalciferol (VITAMIN D) 50 MCG (2000 UT) CAPS, , Disp: , Rfl:  .  Coenzyme Q10-Vitamin E (QUNOL ULTRA COQ10 PO), , Disp: , Rfl:  .  hydrochlorothiazide (MICROZIDE) 12.5 MG capsule, Take 1 capsule (12.5 mg total) by mouth daily., Disp: 30 capsule, Rfl: 0 .  losartan (COZAAR) 100 MG tablet, Take 1 tablet (100 mg total) by mouth daily., Disp: 90 tablet, Rfl: 1 .  metoprolol succinate (TOPROL-XL) 25 MG 24 hr tablet, Take 1 tablet (25 mg total) by mouth daily., Disp: 90 tablet, Rfl: 1  No Known Allergies  I personally reviewed active problem list, medication list, allergies, notes from last encounter, lab results with the patient/caregiver today.   ROS  Constitutional: Negative for fever or weight change.  Respiratory: Negative for cough and shortness of breath.   Cardiovascular: Negative for chest pain or palpitations.  Gastrointestinal: Negative for abdominal pain, no bowel changes.  Musculoskeletal: Negative for gait problem or joint swelling.  Skin: Negative for rash.  Neurological: Negative for dizziness or headache.  No other specific complaints in a complete review of systems (except as listed in HPI above)  Objective  Today's Vitals   04/21/19 0816  BP: (!) 151/88   There is no height or weight on file to calculate BMI.  Physical Exam  Pulmonary/Chest: Effort normal. No respiratory distress. Speaking in complete sentences Neurological: Pt is alert and oriented to person, place, and time. Speech is normal. Psychiatric: Patient has a normal mood and affect. behavior is normal. Judgment and thought content normal.   No results found for this or any previous visit (from the past 72 hour(s)).  PHQ2/9: Depression screen [facility] 2/9 04/21/2019 01/21/2019 10/21/2018 06/10/2018  Decreased Interest 0 0 0 0  Down, Depressed, Hopeless 0 0 0 0  PHQ - 2 Score 0 0 0 0  Altered sleeping 0 0 0 0   Tired, decreased energy 0 0 0 0  Change in appetite 0 0 0 0  Feeling bad or failure about yourself  0 0 0 0  Trouble concentrating 0 0 0 0  Moving slowly or fidgety/restless 0 0 0 0  Suicidal thoughts 0 0 0 0  PHQ-9 Score 0 0 0 0  Difficult doing work/chores Not difficult at all Not difficult at all Not difficult at all Not difficult at all   PHQ-2/9 Result is negative.    Fall Risk: Fall Risk  04/21/2019 01/21/2019 10/21/2018 06/10/2018  Falls in the past year? 0 0 0 0  Number falls in past yr: 0 0 - -  Injury with Fall? 0 0 - -  Follow up Falls evaluation completed - - -  Assessment & Plan  1. Essential hypertension, benign - We will increase HCTZ to 25mg , recheck BP in 2 weeks and draw labs at that time. DASH diet discussed in detail including decreasing her fried food and soda intake. - hydrochlorothiazide (HYDRODIURIL) 25 MG tablet; Take 1 tablet (25 mg total) by mouth daily. **Note dose change**  Dispense: 90 tablet; Refill: 1 - COMPLETE METABOLIC PANEL WITH GFR  2. Mixed hyperlipidemia - Continue statin therapy and Co-q-10.  Due for labs - Lipid panel  3. Morbid obesity ([place]) - Discussed importance of 150 minutes of physical activity weekly, eat two servings of fish weekly, eat one serving of tree nuts ( cashews, pistachios, pecans, almonds.Marland Kitchen) every other day, eat 6 servings of fruit/vegetables daily and drink plenty of water and avoid sweet beverages.    I discussed the assessment and treatment plan with the patient. The patient was provided an opportunity to ask questions and all were answered. The patient agreed with the plan and demonstrated an understanding of the instructions.   The patient was advised to call back or seek an in-person evaluation if the symptoms worsen or if the condition fails to improve as anticipated.  I provided 15 minutes of non-face-to-face time during this encounter.  Hubbard Hartshorn, FNP

## 2019-04-23 ENCOUNTER — Encounter: Payer: Self-pay | Admitting: Family Medicine

## 2019-05-21 ENCOUNTER — Other Ambulatory Visit: Payer: Self-pay

## 2019-05-21 ENCOUNTER — Ambulatory Visit: Payer: Managed Care, Other (non HMO) | Admitting: Emergency Medicine

## 2019-05-21 VITALS — BP 124/84 | HR 99

## 2019-05-21 DIAGNOSIS — I10 Essential (primary) hypertension: Secondary | ICD-10-CM

## 2019-05-21 LAB — COMPLETE METABOLIC PANEL WITH GFR
AG Ratio: 1.4 (calc) (ref 1.0–2.5)
ALT: 13 U/L (ref 6–29)
AST: 13 U/L (ref 10–35)
Albumin: 4.2 g/dL (ref 3.6–5.1)
Alkaline phosphatase (APISO): 52 U/L (ref 37–153)
BUN: 19 mg/dL (ref 7–25)
CO2: 27 mmol/L (ref 20–32)
Calcium: 9.9 mg/dL (ref 8.6–10.4)
Chloride: 102 mmol/L (ref 98–110)
Creat: 0.84 mg/dL (ref 0.50–0.99)
GFR, Est African American: 85 mL/min/{1.73_m2} (ref >=60)
GFR, Est Non African American: 73 mL/min/{1.73_m2} (ref >=60)
Globulin: 3 g/dL (calc) (ref 1.9–3.7)
Glucose, Bld: 113 mg/dL — ABNORMAL HIGH (ref 65–99)
Potassium: 4.5 mmol/L (ref 3.5–5.3)
Sodium: 140 mmol/L (ref 135–146)
Total Bilirubin: 0.7 mg/dL (ref 0.2–1.2)
Total Protein: 7.2 g/dL (ref 6.1–8.1)

## 2019-05-21 LAB — LIPID PANEL
Cholesterol: 150 mg/dL (ref <200)
HDL: 46 mg/dL — ABNORMAL LOW (ref >=50)
LDL Cholesterol (Calc): 80 mg/dL (calc)
Non-HDL Cholesterol (Calc): 104 mg/dL (calc) (ref <130)
Total CHOL/HDL Ratio: 3.3 (calc) (ref <5.0)
Triglycerides: 139 mg/dL (ref <150)

## 2019-06-02 ENCOUNTER — Other Ambulatory Visit: Payer: Self-pay | Admitting: Family Medicine

## 2019-06-02 DIAGNOSIS — Z1231 Encounter for screening mammogram for malignant neoplasm of breast: Secondary | ICD-10-CM

## 2019-06-06 ENCOUNTER — Other Ambulatory Visit: Payer: Self-pay | Admitting: Family Medicine

## 2019-06-06 DIAGNOSIS — E782 Mixed hyperlipidemia: Secondary | ICD-10-CM

## 2019-06-11 ENCOUNTER — Other Ambulatory Visit: Payer: Self-pay | Admitting: Family Medicine

## 2019-06-22 ENCOUNTER — Ambulatory Visit
Admission: RE | Admit: 2019-06-22 | Discharge: 2019-06-22 | Disposition: A | Discharge status: Home / Self Care | Payer: Medicare HMO | Source: Ambulatory Visit | Attending: Family Medicine | Admitting: Family Medicine

## 2019-06-22 DIAGNOSIS — Z1231 Encounter for screening mammogram for malignant neoplasm of breast: Secondary | ICD-10-CM | POA: Diagnosis present

## 2019-07-04 ENCOUNTER — Other Ambulatory Visit: Payer: Self-pay | Admitting: Family Medicine

## 2019-07-04 DIAGNOSIS — E782 Mixed hyperlipidemia: Secondary | ICD-10-CM

## 2019-07-27 ENCOUNTER — Other Ambulatory Visit: Payer: Self-pay | Admitting: Family Medicine

## 2019-07-27 DIAGNOSIS — E782 Mixed hyperlipidemia: Secondary | ICD-10-CM

## 2019-07-27 DIAGNOSIS — I10 Essential (primary) hypertension: Secondary | ICD-10-CM

## 2019-07-30 ENCOUNTER — Other Ambulatory Visit: Payer: Self-pay | Admitting: Family Medicine

## 2019-07-30 DIAGNOSIS — E782 Mixed hyperlipidemia: Secondary | ICD-10-CM

## 2019-08-13 ENCOUNTER — Other Ambulatory Visit: Payer: Self-pay

## 2019-08-13 ENCOUNTER — Encounter: Payer: Self-pay | Admitting: Family Medicine

## 2019-08-13 ENCOUNTER — Ambulatory Visit (INDEPENDENT_AMBULATORY_CARE_PROVIDER_SITE_OTHER): Payer: Medicare HMO | Admitting: Family Medicine

## 2019-08-13 ENCOUNTER — Other Ambulatory Visit: Payer: Self-pay | Admitting: Family Medicine

## 2019-08-13 VITALS — BP 132/84 | HR 92 | Temp 98.5°F | Resp 14 | Ht 66.0 in | Wt 262.6 lb

## 2019-08-13 DIAGNOSIS — E559 Vitamin D deficiency, unspecified: Secondary | ICD-10-CM

## 2019-08-13 DIAGNOSIS — E782 Mixed hyperlipidemia: Secondary | ICD-10-CM

## 2019-08-13 DIAGNOSIS — Z23 Encounter for immunization: Secondary | ICD-10-CM

## 2019-08-13 DIAGNOSIS — Z130 Encounter for screening for diseases of the blood and blood-forming organs and certain disorders involving the immune mechanism: Secondary | ICD-10-CM

## 2019-08-13 DIAGNOSIS — Z6841 Body Mass Index (BMI) 40.0 and over, adult: Secondary | ICD-10-CM

## 2019-08-13 DIAGNOSIS — Z0000 Encounter for general adult medical examination without abnormal findings: Secondary | ICD-10-CM

## 2019-08-13 DIAGNOSIS — Z780 Asymptomatic menopausal state: Secondary | ICD-10-CM

## 2019-08-13 DIAGNOSIS — Z1329 Encounter for screening for other suspected endocrine disorder: Secondary | ICD-10-CM | POA: Diagnosis not present

## 2019-08-13 DIAGNOSIS — Z5181 Encounter for therapeutic drug level monitoring: Secondary | ICD-10-CM

## 2019-08-13 DIAGNOSIS — Z136 Encounter for screening for cardiovascular disorders: Secondary | ICD-10-CM

## 2019-08-13 DIAGNOSIS — Z131 Encounter for screening for diabetes mellitus: Secondary | ICD-10-CM

## 2019-08-13 DIAGNOSIS — I10 Essential (primary) hypertension: Secondary | ICD-10-CM

## 2019-08-13 DIAGNOSIS — Z13228 Encounter for screening for other metabolic disorders: Secondary | ICD-10-CM

## 2019-08-13 MED ORDER — LOSARTAN POTASSIUM-HCTZ 100-25 MG PO TABS: 1.0000 | ORAL_TABLET | Freq: Every day | ORAL | 3 refills | Status: DC

## 2019-08-13 NOTE — Patient Instructions (Addendum)
Ms. Laurich , Thank you for taking time to come for your Medicare Wellness Visit. I appreciate your ongoing commitment to your health goals. Please review the following plan we discussed and let me know if I can assist you in the future.   Colonoscopy - We'll contact [facility] to see when you are due and we will update that.  This is a list of the screening recommended for you and due dates:  Health Maintenance  Topic Date Due  . DEXA scan (bone density measurement)  07/06/2019  . HIV Screening  08/12/2020*  . Pneumonia vaccines (1 of 2 - PCV13) 08/12/2020*  . Mammogram  06/21/2020  . Colon Cancer Screening  03/16/2027  . Tetanus Vaccine  04/16/2027  . Flu Shot  Completed  .  Hepatitis C: One time screening is recommended by Center for Disease Control  (CDC) for  adults born from [birth year] through 1966.   Completed  *Topic was postponed. The date shown is not the original due date.     Preventing Osteoporosis, Adult Osteoporosis is a condition that causes the bones to lose density. This means that the bones become thinner, and the normal spaces in bone tissue become larger. Low bone density can make the bones weak and cause them to break more easily. Osteoporosis cannot always be prevented, but you can take steps to lower your risk of developing this condition. How can this condition affect me? If you develop osteoporosis, you will be more likely to break bones in your wrist, spine, or hip. Even a minor accident or injury can be enough to break weak bones. The bones will also be slower to heal. Osteoporosis can cause other problems as well, such as a stooped posture or trouble with movement. Osteoporosis can occur with aging. As you get older, you may lose bone tissue more quickly, or it may be replaced more slowly. Osteoporosis is more likely to develop if you have poor nutrition or do not get enough calcium or vitamin D. Other lifestyle factors can also play a role. By eating a well-balanced diet  and making lifestyle changes, you can help keep your bones strong and healthy, lowering your chances of developing osteoporosis. What can increase my risk? The following factors may make you more likely to develop osteoporosis:  Having a family history of the condition.  Having poor nutrition or not getting enough calcium or vitamin D.  Using certain medicines, such as steroid medicines or antiseizure medicines.  Being any of the following: ? 91 years of age or older. ? Female. ? A woman who has gone through menopause (is postmenopausal). ? White (Caucasian) or of Asian descent.  Smoking or having a history of smoking.  Not being physically active (being sedentary).  Having a small body frame. What actions can I take to prevent this?  Get enough calcium   Make sure you get enough calcium every day. Calcium is the most important mineral for bone health. Most people can get enough calcium from their diet, but supplements may be recommended for people who are at risk for osteoporosis. Follow these guidelines: ? If you are age 42 or younger, aim to get 1,000 mg of calcium every day. ? If you are older than age 37, aim to get 1,200 mg of calcium every day.  Good sources of calcium include: ? Dairy products, such as low-fat or nonfat milk, cheese, and yogurt. ? Dark green leafy vegetables, such as bok choy and broccoli. ? Foods that have had  calcium added to them (calcium-fortified foods), such as orange juice, cereal, bread, soy beverages, and tofu products. ? Nuts, such as almonds.  Check nutrition labels to see how much calcium is in a food or drink. Get enough vitamin D  Try to get enough vitamin D every day. Vitamin D is the most essential vitamin for bone health. It helps the body absorb calcium. Follow these guidelines for how much vitamin D to get from food: ? If you are age 74 or younger, aim to get at least 600 international units (IU) every day. Your health care provider  may suggest more. ? If you are older than age 43, aim to get at least 800 international units every day. Your health care provider may suggest more.  Good sources of vitamin D in your diet include: ? Egg yolks. ? Oily fish, such as salmon, sardines, and tuna. ? Milk and cereal fortified with vitamin D.  Your body also makes vitamin D when you are out in the sun. Exposing the bare skin on your face, arms, legs, or back to the sun for no more than 30 minutes a day, 2 times a week is more than enough. Beyond that, make sure you use sunblock to protect your skin from sunburn, which increases your risk for skin cancer. Exercise  Stay active and get exercise every day.  Ask your health care provider what types of exercise are best for you. Weight-bearing and strength-building activities are important for building and maintaining healthy bones. Some examples of these types of activities include: ? Walking and hiking. ? Jogging and running. ? Dancing. ? Gym exercises. ? Lifting weights. ? Tennis and racquetball. ? Climbing stairs. ? Aerobics. Make other lifestyle changes  Do not use any products that contain nicotine or tobacco, such as cigarettes, e-cigarettes, and chewing tobacco. If you need help quitting, ask your health care provider.  Lose weight if you are overweight.  If you drink alcohol: ? Limit how much you use to:  0-1 drink a day for nonpregnant women.  0-2 drinks a day for men. ? Be aware of how much alcohol is in your drink. In the [place] one drink equals one 12 oz bottle of beer (355 mL), one 5 oz glass of wine (148 mL), or one 1 oz glass of hard liquor (44 mL). Where to find support If you need help making changes to prevent osteoporosis, talk with your health care provider. You can ask for a referral to a diet and nutrition specialist (dietitian) and a physical therapist. Where to find more information Learn more about osteoporosis from:  NIH Osteoporosis and Related  [place]: www.bones.SouthExposed.es  U[place]S. Office on [employer] Health: [place]BeachSigns.tn  [place]: EquipmentWeekly.com.ee Summary  Osteoporosis is a condition that causes weak bones that are more likely to break.  Eat a healthy diet, making sure you get enough calcium and vitamin D, and stay active by getting regular exercise to help prevent osteoporosis.  Other ways to reduce your risk of osteoporosis include maintaining a healthy weight and avoiding alcohol and products that contain nicotine or tobacco. This information is not intended to replace advice given to you by your health care provider. Make sure you discuss any questions you have with your health care provider. Document Revised: 12/26/2018 Document Reviewed: 12/26/2018 Elsevier Patient Education  [place] Prevention in the Home, Adult Falls can cause injuries. They can happen to people of all ages. There are  many things you can do to make your home safe and to help prevent falls. Ask for help when making these changes, if needed. What actions can I take to prevent falls? General Instructions  Use good lighting in all rooms. Replace any light bulbs that burn out.  Turn on the lights when you go into a dark area. Use night-lights.  Keep items that you use often in easy-to-reach places. Lower the shelves around your home if necessary.  Set up your furniture so you have a clear path. Avoid moving your furniture around.  Do not have throw rugs and other things on the floor that can make you trip.  Avoid walking on wet floors.  If any of your floors are uneven, fix them.  Add color or contrast paint or tape to clearly mark and help you see: ? Any grab bars or handrails. ? First and last steps of stairways. ? Where the edge of each step is.  If you use a stepladder: ? Make sure that it is fully opened. Do not climb a closed stepladder. ? Make sure that both sides of  the stepladder are locked into place. ? Ask someone to hold the stepladder for you while you use it.  If there are any pets around you, be aware of where they are. What can I do in the bathroom?      Keep the floor dry. Clean up any water that spills onto the floor as soon as it happens.  Remove soap buildup in the tub or shower regularly.  Use non-skid mats or decals on the floor of the tub or shower.  Attach bath mats securely with double-sided, non-slip rug tape.  If you need to sit down in the shower, use a plastic, non-slip stool.  Install grab bars by the toilet and in the tub and shower. Do not use towel bars as grab bars. What can I do in the bedroom?  Make sure that you have a light by your bed that is easy to reach.  Do not use any sheets or blankets that are too big for your bed. They should not hang down onto the floor.  Have a firm chair that has side arms. You can use this for support while you get dressed. What can I do in the kitchen?  Clean up any spills right away.  If you need to reach something above you, use a strong step stool that has a grab bar.  Keep electrical cords out of the way.  Do not use floor polish or wax that makes floors slippery. If you must use wax, use non-skid floor wax. What can I do with my stairs?  Do not leave any items on the stairs.  Make sure that you have a light switch at the top of the stairs and the bottom of the stairs. If you do not have them, ask someone to add them for you.  Make sure that there are handrails on both sides of the stairs, and use them. Fix handrails that are broken or loose. Make sure that handrails are as long as the stairways.  Install non-slip stair treads on all stairs in your home.  Avoid having throw rugs at the top or bottom of the stairs. If you do have throw rugs, attach them to the floor with carpet tape.  Choose a carpet that does not hide the edge of the steps on the stairway.  Check  any carpeting to make sure that it  is firmly attached to the stairs. Fix any carpet that is loose or worn. What can I do on the outside of my home?  Use bright outdoor lighting.  Regularly fix the edges of walkways and driveways and fix any cracks.  Remove anything that might make you trip as you walk through a door, such as a raised step or threshold.  Trim any bushes or trees on the path to your home.  Regularly check to see if handrails are loose or broken. Make sure that both sides of any steps have handrails.  Install guardrails along the edges of any raised decks and porches.  Clear walking paths of anything that might make someone trip, such as tools or rocks.  Have any leaves, snow, or ice cleared regularly.  Use sand or salt on walking paths during winter.  Clean up any spills in your garage right away. This includes grease or oil spills. What other actions can I take?  Wear shoes that: ? Have a low heel. Do not wear high heels. ? Have rubber bottoms. ? Are comfortable and fit you well. ? Are closed at the toe. Do not wear open-toe sandals.  Use tools that help you move around (mobility aids) if they are needed. These include: ? Canes. ? Walkers. ? Scooters. ? Crutches.  Review your medicines with your doctor. Some medicines can make you feel dizzy. This can increase your chance of falling. Ask your doctor what other things you can do to help prevent falls. Where to find more information  Centers for Disease Control and Prevention, [place]: https://garcia.biz/  [employer] on Aging: BrainJudge.co.uk Contact a doctor if:  You are afraid of falling at home.  You feel weak, drowsy, or dizzy at home.  You fall at home. Summary  There are many simple things that you can do to make your home safe and to help prevent falls.  Ways to make your home safe include removing tripping hazards and installing grab bars in the bathroom.  Ask for help when  making these changes in your home. This information is not intended to replace advice given to you by your health care provider. Make sure you discuss any questions you have with your health care provider. Document Revised: 09/18/2018 Document Reviewed: 01/10/2017 Elsevier Patient Education  2020 [place] [facility]) Exercise Recommendation  Being physically active is important to prevent heart disease and stroke, the nation's No. 1and No. 5killers. To improve overall cardiovascular health, we suggest at least 150 minutes per week of moderate exercise or 75 minutes per week of vigorous exercise (or a combination of moderate and vigorous activity). Thirty minutes a day, five times a week is an easy goal to remember. You will also experience benefits even if you divide your time into two or three segments of 10 to 15 minutes per day.  For people who would benefit from lowering their blood pressure or cholesterol, we recommend 40 minutes of aerobic exercise of moderate to vigorous intensity three to four times a week to lower the risk for heart attack and stroke.  Physical activity is anything that makes you move your body and burn calories.  This includes things like climbing stairs or playing sports. Aerobic exercises benefit your heart, and include walking, jogging, swimming or biking. Strength and stretching exercises are best for overall stamina and flexibility.  The simplest, positive change you can make to effectively improve your heart health is to start walking. It's enjoyable, free,  easy, social and great exercise. A walking program is flexible and boasts high success rates because people can stick with it. It's easy for walking to become a regular and satisfying part of life.   For Overall Cardiovascular Health:  At least 30 minutes of moderate-intensity aerobic activity at least 5 days per week for a total of 150  OR   At least 25 minutes of vigorous  aerobic activity at least 3 days per week for a total of 75 minutes; or a combination of moderate- and vigorous-intensity aerobic activity  AND   Moderate- to high-intensity muscle-strengthening activity at least 2 days per week for additional health benefits.  For Lowering Blood Pressure and Cholesterol  An average 40 minutes of moderate- to vigorous-intensity aerobic activity 3 or 4 times per week  What if I can't make it to the time goal? Something is always better than nothing! And everyone has to start somewhere. Even if you've been sedentary for years, today is the day you can begin to make healthy changes in your life. If you don't think you'll make it for 30 or 40 minutes, set a reachable goal for today. You can work up toward your overall goal by increasing your time as you get stronger. Don't let all-or-nothing thinking rob you of doing what you can every day.  Source:http://www.heart.org      Preventive Care 30 Years and Older, Female Preventive care refers to lifestyle choices and visits with your health care provider that can promote health and wellness. This includes:  A yearly physical exam. This is also called an annual well check.  Regular dental and eye exams.  Immunizations.  Screening for certain conditions.  Healthy lifestyle choices, such as diet and exercise. What can I expect for my preventive care visit? Physical exam Your health care provider will check:  Height and weight. These may be used to calculate body mass index (BMI), which is a measurement that tells if you are at a healthy weight.  Heart rate and blood pressure.  Your skin for abnormal spots. Counseling Your health care provider may ask you questions about:  Alcohol, tobacco, and drug use.  Emotional well-being.  Home and relationship well-being.  Sexual activity.  Eating habits.  History of falls.  Memory and ability to understand (cognition).  Work and work Statistician.   Pregnancy and menstrual history. What immunizations do I need?  Influenza (flu) vaccine  This is recommended every year. Tetanus, diphtheria, and pertussis (Tdap) vaccine  You may need a Td booster every 10 years. Varicella (chickenpox) vaccine  You may need this vaccine if you have not already been vaccinated. Zoster (shingles) vaccine  You may need this after age 53. Pneumococcal conjugate (PCV13) vaccine  One dose is recommended after age 62. Pneumococcal polysaccharide (PPSV23) vaccine  One dose is recommended after age 74. Measles, mumps, and rubella (MMR) vaccine  You may need at least one dose of MMR if you were born in 1957 or later. You may also need a second dose. Meningococcal conjugate (MenACWY) vaccine  You may need this if you have certain conditions. Hepatitis A vaccine  You may need this if you have certain conditions or if you travel or work in places where you may be exposed to hepatitis A. Hepatitis B vaccine  You may need this if you have certain conditions or if you travel or work in places where you may be exposed to hepatitis B. Haemophilus influenzae type b (Hib) vaccine  You may  need this if you have certain conditions. You may receive vaccines as individual doses or as more than one vaccine together in one shot (combination vaccines). Talk with your health care provider about the risks and benefits of combination vaccines. What tests do I need? Blood tests  Lipid and cholesterol levels. These may be checked every 5 years, or more frequently depending on your overall health.  Hepatitis C test.  Hepatitis B test. Screening  Lung cancer screening. You may have this screening every year starting at age 54 if you have a 30-pack-year history of smoking and currently smoke or have quit within the past 15 years.  Colorectal cancer screening. All adults should have this screening starting at age 62 and continuing until age 32. Your health care  provider may recommend screening at age 25 if you are at increased risk. You will have tests every 1-10 years, depending on your results and the type of screening test.  Diabetes screening. This is done by checking your blood sugar (glucose) after you have not eaten for a while (fasting). You may have this done every 1-3 years.  Mammogram. This may be done every 1-2 years. Talk with your health care provider about how often you should have regular mammograms.  BRCA-related cancer screening. This may be done if you have a family history of breast, ovarian, tubal, or peritoneal cancers. Other tests  Sexually transmitted disease (STD) testing.  Bone density scan. This is done to screen for osteoporosis. You may have this done starting at age 52. Follow these instructions at home: Eating and drinking  Eat a diet that includes fresh fruits and vegetables, whole grains, lean protein, and low-fat dairy products. Limit your intake of foods with high amounts of sugar, saturated fats, and salt.  Take vitamin and mineral supplements as recommended by your health care provider.  Do not drink alcohol if your health care provider tells you not to drink.  If you drink alcohol: ? Limit how much you have to 0-1 drink a day. ? Be aware of how much alcohol is in your drink. In the [place] one drink equals one 12 oz bottle of beer (355 mL), one 5 oz glass of wine (148 mL), or one 1 oz glass of hard liquor (44 mL). Lifestyle  Take daily care of your teeth and gums.  Stay active. Exercise for at least 30 minutes on 5 or more days each week.  Do not use any products that contain nicotine or tobacco, such as cigarettes, e-cigarettes, and chewing tobacco. If you need help quitting, ask your health care provider.  If you are sexually active, practice safe sex. Use a condom or other form of protection in order to prevent STIs (sexually transmitted infections).  Talk with your health care provider about taking  a low-dose aspirin or statin. What's next?  Go to your health care provider once a year for a well check visit.  Ask your health care provider how often you should have your eyes and teeth checked.  Stay up to date on all vaccines. This information is not intended to replace advice given to you by your health care provider. Make sure you discuss any questions you have with your health care provider. Document Revised: 05/22/2018 Document Reviewed: 05/22/2018 Elsevier Patient Education  2020 [employer].

## 2019-08-13 NOTE — Progress Notes (Deleted)
Subjective:    Pamela Dixon is a 64 y.o. female who presents for a Welcome to Medicare exam, she also came for her CPE/routine f/up  Exercise/Activity:  started walking again Diet/nutrition:  Cooking at home, "not healthy" a lot of fried foods, potatoes and pasta  Sleep:  Sleeps well Gained weight over the past year due to [place] Blood pressure has been high, but with med changes over the past couple months is now better controlled on hctz 25 mg, losartan 100 mg and metoprolol 25 mg, she is tolerating new meds, and has no SE or complaints She is compliant with statin - had discussed with pcp taking 3 days a week, she's willing to take more, she has not had any SE    Review of Systems  Constitutional: Negative.  Negative for activity change, appetite change, fatigue and unexpected weight change.  HENT: Negative.   Eyes: Negative.   Respiratory: Negative.  Negative for shortness of breath.   Cardiovascular: Negative.  Negative for chest pain, palpitations and leg swelling.  Gastrointestinal: Negative.  Negative for abdominal pain and blood in stool.  Endocrine: Negative.   Genitourinary: Negative.   Musculoskeletal: Negative.  Negative for arthralgias, gait problem, joint swelling and myalgias.  Skin: Negative.  Negative for color change, pallor and rash.  Allergic/Immunologic: Negative.   Neurological: Negative.  Negative for syncope and weakness.  Hematological: Negative.   Psychiatric/Behavioral: Negative.  Negative for confusion, dysphoric mood, self-injury and suicidal ideas. The patient is not nervous/anxious.       Cardiac Risk Factors include: dyslipidemia;obesity (BMI >30kg/m2);sedentary lifestyle;hypertension      Objective:    Today's Vitals   08/13/19 0957  BP: 132/84  Pulse: 92  Resp: 14  Temp: 98.5 F (36.9 C)  SpO2: 98%  Weight: 262 lb 9.6 oz (119.1 kg)  Height: 5\' 6"  (1.676 m)  Body mass index is 42.38 kg/m.  Medications Outpatient Encounter  Medications as of 08/13/2019  Medication Sig  . aspirin (BAYER ASPIRIN EC LOW DOSE) 81 MG EC tablet Take 1 tablet (81 mg total) by mouth daily.  Marland Kitchen atorvastatin (LIPITOR) 20 MG tablet TAKE 1 TABLET ON MONDAY, WEDNESDAY, FRIDAYS.  Marland Kitchen Cholecalciferol (VITAMIN D) 50 MCG (2000 UT) CAPS   . Coenzyme Q10-Vitamin E (QUNOL ULTRA COQ10 PO)   . metoprolol succinate (TOPROL-XL) 25 MG 24 hr tablet TAKE 1 TABLET BY MOUTH EVERY DAY  . [DISCONTINUED] hydrochlorothiazide (HYDRODIURIL) 25 MG tablet TAKE 1 TABLET (25 MG TOTAL) BY MOUTH DAILY. **NOTE DOSE CHANGE**  . [DISCONTINUED] losartan (COZAAR) 100 MG tablet TAKE 1 TABLET BY MOUTH EVERY DAY  . losartan-hydrochlorothiazide (HYZAAR) 100-25 MG tablet Take 1 tablet by mouth daily.   No facility-administered encounter medications on file as of 08/13/2019.     History: Past Medical History:  Diagnosis Date  . Hypertension    Past Surgical History:  Procedure Laterality Date  . ABDOMINAL HYSTERECTOMY  Early 90's  . COLONOSCOPY WITH PROPOFOL N/A 03/15/2017   Procedure: COLONOSCOPY WITH PROPOFOL;  Surgeon: Manya Silvas, MD;  Location: [facility] ENDOSCOPY;  Service: Endoscopy;  Laterality: N/A;  . TUBAL LIGATION  1985    Family History  Problem Relation Age of Onset  . Diabetes Father   . Cancer Father        colon skin cancer to head  . Breast cancer Neg Hx    Social History   Occupational History  . Not on file  Tobacco Use  . Smoking status: Never Smoker  .  Smokeless tobacco: Never Used  Substance and Sexual Activity  . Alcohol use: No    Alcohol/week: 0.0 standard drinks  . Drug use: No  . Sexual activity: Yes    Tobacco Counseling Counseling given: Not Answered   Immunizations and Health Maintenance Immunization History  Administered Date(s) Administered  . Influenza Inj Mdck Quad Pf 03/31/2018  . Influenza,inj,Quad PF,6+ Mos 03/19/2019  . PFIZER SARS-COV-2 Vaccination 08/12/2019  . Tdap 04/15/2017  . Zoster Recombinat (Shingrix)  12/05/2017, 03/19/2019   Health Maintenance Due  Topic Date Due  . DEXA SCAN  07/06/2019    Activities of Daily Living In your present state of health, do you have any difficulty performing the following activities: 08/13/2019 01/21/2019  Hearing? N N  Vision? N N  Difficulty concentrating or making decisions? N N  Walking or climbing stairs? N N  Dressing or bathing? N N  Doing errands, shopping? N N  Preparing Food and eating ? N -  Using the Toilet? N -  In the past six months, have you accidently leaked urine? N -  Do you have problems with loss of bowel control? N -  Managing your Medications? N -  Managing your Finances? N -  Some recent data might be hidden    Physical Exam      Advanced Directives: Does Patient Have a Medical Advance Directive?: No Would patient like information on creating a medical advance directive?: Yes (MAU/Ambulatory/Procedural Areas - Information given)    Assessment:    This is a routine wellness examination for this patient . ***  Vision/Hearing screen  Hearing Screening   125Hz  250Hz  500Hz  1000Hz  2000Hz  3000Hz  4000Hz  6000Hz  8000Hz   Right ear:   Pass Pass Pass  Pass    Left ear:   Pass Pass Pass  Pass      Visual Acuity Screening   Right eye Left eye Both eyes  Without correction:     With correction: 20/50 20/30 20/30     Dietary issues and exercise activities discussed:  Current Exercise Habits: Home exercise routine, Type of exercise: walking, Time (Minutes): 20, Frequency (Times/Week): 2, Weekly Exercise (Minutes/Week): 40, Intensity: Mild, Exercise limited by: None identified  Goals   None    Depression Screen PHQ 2/9 Scores 08/13/2019 04/21/2019 01/21/2019 10/21/2018  PHQ - 2 Score 0 0 0 0  PHQ- 9 Score 0 0 0 0     Fall Risk Fall Risk  08/13/2019  Falls in the past year? 0  Number falls in past yr: 0  Injury with Fall? 0  Follow up -    Cognitive Function:     6CIT Screen 08/13/2019  What Year? 0 points  What month?  0 points  What time? 0 points  Count back from 20 0 points  Months in reverse 0 points  Repeat phrase 0 points  Total Score 0    Patient Care Team: Hubbard Hartshorn, FNP as PCP - General (Family Medicine)     Plan:   ***  I have personally reviewed and noted the following in the patient's chart:   . Medical and social history . Use of alcohol, tobacco or illicit drugs  . Current medications and supplements . Functional ability and status . Nutritional status . Physical activity . Advanced directives . List of other physicians . Hospitalizations, surgeries, and ER visits in previous 12 months . Vitals . Screenings to include cognitive, depression, and falls . Referrals and appointments  In addition, I have reviewed and discussed  with patient certain preventive protocols, quality metrics, and best practice recommendations. A written personalized care plan for preventive services as well as general preventive health recommendations were provided to patient.     Delsa Grana, PA-C 08/20/2019

## 2019-08-13 NOTE — Progress Notes (Deleted)
Patient: Pamela Dixon, Female    DOB: 1955/07/31, 64 y.o.   MRN: [id number] Hubbard Hartshorn, FNP Visit Date: 08/13/2019  Today's Provider: Delsa Grana, PA-C   Chief Complaint  Patient presents with  . Annual Exam   Subjective:   Annual physical exam:  Pamela Dixon is a 64 y.o. female who presents today for complete physical exam:  Exercise/Activity:  started walking again Diet/nutrition:  Cooking at home, "not healthy" a lot of fried foods, potatoes and pasta  Sleep:  Sleeps well  Wt Readings from Last 5 Encounters:  08/13/19 262 lb 9.6 oz (119.1 kg)  10/21/18 254 lb 9.6 oz (115.5 kg)  06/10/18 245 lb 14.4 oz (111.5 kg)  05/02/18 235 lb (106.6 kg)  04/15/17 246 lb (111.6 kg)   BMI Readings from Last 5 Encounters:  08/13/19 42.38 kg/m  10/21/18 41.09 kg/m  06/10/18 40.92 kg/m  05/02/18 37.93 kg/m  04/15/17 38.53 kg/m     USPSTF grade A and B recommendations - reviewed and addressed today  Depression:  Phq 9 completed today by patient, was reviewed by me with patient in the room PHQ score is ***, pt feels *** PHQ 2/9 Scores 08/13/2019 04/21/2019 01/21/2019 10/21/2018  PHQ - 2 Score 0 0 0 0  PHQ- 9 Score 0 0 0 0   Depression screen [facility] 2/9 08/13/2019 04/21/2019 01/21/2019 10/21/2018 06/10/2018  Decreased Interest 0 0 0 0 0  Down, Depressed, Hopeless 0 0 0 0 0  PHQ - 2 Score 0 0 0 0 0  Altered sleeping 0 0 0 0 0  Tired, decreased energy 0 0 0 0 0  Change in appetite 0 0 0 0 0  Feeling bad or failure about yourself  0 0 0 0 0  Trouble concentrating 0 0 0 0 0  Moving slowly or fidgety/restless 0 0 0 0 0  Suicidal thoughts 0 0 0 0 0  PHQ-9 Score 0 0 0 0 0  Difficult doing work/chores Not difficult at all Not difficult at all Not difficult at all Not difficult at all Not difficult at all    Alcohol screening:   Office Visit from 08/13/2019 in [facility]  AUDIT-C Score  1      Immunizations and Health Maintenance: Health Maintenance    Topic Date Due  . PAP SMEAR-Modifier  07/06/1975  . DEXA SCAN  07/06/2019  . HIV Screening  08/12/2020 (Originally 07/05/1969)  . PNA vac Low Risk Adult (1 of 2 - PCV13) 08/12/2020 (Originally 07/06/2019)  . MAMMOGRAM  06/21/2021  . COLONOSCOPY  03/16/2027  . TETANUS/TDAP  04/16/2027  . INFLUENZA VACCINE  Completed  . Hepatitis C Screening  Completed     Hep C Screening: ***  STD testing and prevention (HIV/chl/gon/syphilis):  see above, no additional testing desired by pt today  Intimate partner violence:***  Sexual History/Pain during Intercourse: Married  Menstrual History/LMP/Abnormal Bleeding: *** No LMP recorded. Patient has had a hysterectomy.  Incontinence Symptoms: ***  Breast cancer:  Last Mammogram: *see HM list above BRCA gene screening: ***  Cervical cancer screening: *** Pt *** family hx of cancers - breast, ovarian, uterine, colon:     Osteoporosis:   Discussion on osteoporosis per age, including high calcium and vitamin D supplementation, weight bearing exercises Pt is *** supplementing with daily calcium/Vit D. *** Bone scan/dexa Roughly experienced menopause at age ***  Skin cancer:  Hx of skin CA -  NO*** Discussed atypical lesions  Colorectal cancer:   Colonoscopy is ***   Discussed concerning signs and sx of CRC, pt denies ***  Lung cancer:   Low Dose CT Chest recommended if Age 29-80 years, 30 pack-year currently smoking OR have quit w/in 15years. Patient {DOES NOT does:27190::"does not"} qualify.    Social History   Tobacco Use  . Smoking status: Never Smoker  . Smokeless tobacco: Never Used  Substance Use Topics  . Alcohol use: No    Alcohol/week: 0.0 standard drinks  . Drug use: No       Office Visit from 08/13/2019 in [facility]  AUDIT-C Score  1      Family History  Problem Relation Age of Onset  . Diabetes Father   . Breast cancer Neg Hx      Blood pressure/Hypertension: BP Readings from Last 3  Encounters:  08/13/19 132/84  05/21/19 124/84  04/21/19 (!) 151/88    Weight/Obesity: Wt Readings from Last 3 Encounters:  08/13/19 262 lb 9.6 oz (119.1 kg)  10/21/18 254 lb 9.6 oz (115.5 kg)  06/10/18 245 lb 14.4 oz (111.5 kg)   BMI Readings from Last 3 Encounters:  08/13/19 42.38 kg/m  10/21/18 41.09 kg/m  06/10/18 40.92 kg/m     Lipids:  Lab Results  Component Value Date   CHOL 150 05/21/2019   CHOL 205 (H) 10/21/2018   CHOL 216 (H) 06/10/2018   Lab Results  Component Value Date   HDL 46 (L) 05/21/2019   HDL 48 (L) 10/21/2018   HDL 52 06/10/2018   Lab Results  Component Value Date   LDLCALC 80 05/21/2019   LDLCALC 124 (H) 10/21/2018   LDLCALC 123 (H) 06/10/2018   Lab Results  Component Value Date   TRIG 139 05/21/2019   TRIG 211 (H) 10/21/2018   TRIG 264 (H) 06/10/2018   Lab Results  Component Value Date   CHOLHDL 3.3 05/21/2019   CHOLHDL 4.3 10/21/2018   CHOLHDL 4.2 06/10/2018   No results found for: LDLDIRECT Based on the results of lipid panel his/her cardiovascular risk factor ( using [place] )  in the next 10 years is: The 10-year ASCVD risk score Mikey Bussing DC Brooke Bonito., et al., 2013) is: 7.3%   Values used to calculate the score:     Age: 72 years     Sex: Female     Is Non-Hispanic African American: No     Diabetic: No     Tobacco smoker: No     Systolic Blood Pressure: 409 mmHg     Is BP treated: Yes     HDL Cholesterol: 46 mg/dL     Total Cholesterol: 150 mg/dL Glucose:  Glucose, Bld  Date Value Ref Range Status  05/21/2019 113 (H) 65 - 99 mg/dL Final    Comment:    .            Fasting reference interval . For someone without known diabetes, a glucose value between 100 and 125 mg/dL is consistent with prediabetes and should be confirmed with a follow-up test. .   10/21/2018 101 (H) 65 - 99 mg/dL Final    Comment:    .            Fasting reference interval . For someone without known diabetes, a glucose value between 100 and  125 mg/dL is consistent with prediabetes and should be confirmed with a follow-up test. .   06/24/2018 100 65 - 139 mg/dL Final    Comment:    .  Non-fasting reference interval .    Hypertension: BP Readings from Last 3 Encounters:  08/13/19 132/84  05/21/19 124/84  04/21/19 (!) 151/88   Obesity: Wt Readings from Last 3 Encounters:  08/13/19 262 lb 9.6 oz (119.1 kg)  10/21/18 254 lb 9.6 oz (115.5 kg)  06/10/18 245 lb 14.4 oz (111.5 kg)   BMI Readings from Last 3 Encounters:  08/13/19 42.38 kg/m  10/21/18 41.09 kg/m  06/10/18 40.92 kg/m      Advanced Care Planning:  A voluntary discussion about advance care planning including the explanation and discussion of advance directives.   Discussed health care proxy and Living will, and the patient was able to identify a health care proxy as ***.   Patient {DOES_DOES AVW:97948} have a living will at present time.   Social History      She        Social History   Socioeconomic History  . Marital status: Married    Spouse name: Broadus John  . Number of children: 2  . Years of education: 16  . Highest education level: High school graduate  Occupational History  . Not on file  Tobacco Use  . Smoking status: Never Smoker  . Smokeless tobacco: Never Used  Substance and Sexual Activity  . Alcohol use: No    Alcohol/week: 0.0 standard drinks  . Drug use: No  . Sexual activity: Not on file  Other Topics Concern  . Not on file  Social History Narrative  . Not on file   Social Determinants of Health   Financial Resource Strain:   . Difficulty of Paying Living Expenses: Not on file  Food Insecurity:   . Worried About Charity fundraiser in the Last Year: Not on file  . Ran Out of Food in the Last Year: Not on file  Transportation Needs:   . Lack of Transportation (Medical): Not on file  . Lack of Transportation (Non-Medical): Not on file  Physical Activity:   . Days of Exercise per Week: Not on file  . Minutes  of Exercise per Session: Not on file  Stress:   . Feeling of Stress : Not on file  Social Connections:   . Frequency of Communication with Friends and Family: Not on file  . Frequency of Social Gatherings with Friends and Family: Not on file  . Attends Religious Services: Not on file  . Active Member of Clubs or Organizations: Not on file  . Attends Archivist Meetings: Not on file  . Marital Status: Not on file    Family History        Family History  Problem Relation Age of Onset  . Diabetes Father   . Breast cancer Neg Hx     Patient Active Problem List   Diagnosis Date Noted  . Mixed hyperlipidemia 04/21/2019  . Essential hypertension, benign 06/10/2018  . Morbid obesity ([place]) 06/10/2018    Past Surgical History:  Procedure Laterality Date  . COLONOSCOPY WITH PROPOFOL N/A 03/15/2017   Procedure: COLONOSCOPY WITH PROPOFOL;  Surgeon: Manya Silvas, MD;  Location: [facility] ENDOSCOPY;  Service: Endoscopy;  Laterality: N/A;     Current Outpatient Medications:  .  aspirin (BAYER ASPIRIN EC LOW DOSE) 81 MG EC tablet, Take 1 tablet (81 mg total) by mouth daily., Disp: , Rfl:  .  atorvastatin (LIPITOR) 20 MG tablet, TAKE 1 TABLET ON MONDAY, WEDNESDAY, FRIDAYS., Disp: 10 tablet, Rfl: 0 .  Cholecalciferol (VITAMIN D) 50 MCG (2000 UT) CAPS, ,  Disp: , Rfl:  .  Coenzyme Q10-Vitamin E (QUNOL ULTRA COQ10 PO), , Disp: , Rfl:  .  hydrochlorothiazide (HYDRODIURIL) 25 MG tablet, TAKE 1 TABLET (25 MG TOTAL) BY MOUTH DAILY. **NOTE DOSE CHANGE**, Disp: 90 tablet, Rfl: 1 .  losartan (COZAAR) 100 MG tablet, TAKE 1 TABLET BY MOUTH EVERY DAY, Disp: 90 tablet, Rfl: 1 .  metoprolol succinate (TOPROL-XL) 25 MG 24 hr tablet, TAKE 1 TABLET BY MOUTH EVERY DAY, Disp: 90 tablet, Rfl: 1  No Known Allergies  Patient Care Team: Hubbard Hartshorn, FNP as PCP - General (Family Medicine)  Review of Systems   ***       Objective:   Vitals:  Vitals:   08/13/19 0957  BP: 132/84  Pulse: 92    Resp: 14  Temp: 98.5 F (36.9 C)  SpO2: 98%  Weight: 262 lb 9.6 oz (119.1 kg)  Height: _0  (1.676 m)    Body mass index is 42.38 kg/m.  Physical Exam    Fall Risk: Fall Risk  08/13/2019 04/21/2019 01/21/2019 10/21/2018 06/10/2018  Falls in the past year? 0 0 0 0 0  Number falls in past yr: 0 0 0 - -  Injury with Fall? 0 0 0 - -  Follow up - Falls evaluation completed - - -    Functional Status Survey: Is the patient deaf or have difficulty hearing?: No Does the patient have difficulty seeing, even when wearing glasses/contacts?: No Does the patient have difficulty concentrating, remembering, or making decisions?: No Does the patient have difficulty walking or climbing stairs?: No Does the patient have difficulty dressing or bathing?: No Does the patient have difficulty doing errands alone such as visiting a doctor's office or shopping?: No   Assessment & Plan:    CPE completed today  . USPSTF grade A and B recommendations reviewed with patient; age-appropriate recommendations, preventive care, screening tests, etc discussed and encouraged; healthy living encouraged; see AVS for patient education given to patient  . Discussed importance of 150 minutes of physical activity weekly, AHA exercise recommendations given to pt in AVS/handout  . Discussed importance of healthy diet:  eating lean meats and proteins, avoiding trans fats and saturated fats, avoid simple sugars and excessive carbs in diet, eat 6 servings of fruit/vegetables daily and drink plenty of water and avoid sweet beverages.    . Recommended pt to do annual eye exam and routine dental exams/cleanings  . Depression, alcohol, fall screening completed as documented above and per flowsheets  . Reviewed Health Maintenance: Health Maintenance  Topic Date Due  . PAP SMEAR-Modifier  07/06/1975  . DEXA SCAN  07/06/2019  . HIV Screening  08/12/2020 (Originally 07/05/1969)  . PNA vac Low Risk Adult (1 of 2 - PCV13)  08/12/2020 (Originally 07/06/2019)  . MAMMOGRAM  06/21/2021  . COLONOSCOPY  03/16/2027  . TETANUS/TDAP  04/16/2027  . INFLUENZA VACCINE  Completed  . Hepatitis C Screening  Completed    . Immunizations: Immunization History  Administered Date(s) Administered  . Influenza Inj Mdck Quad Pf 03/31/2018  . Influenza,inj,Quad PF,6+ Mos 03/19/2019  . PFIZER SARS-COV-2 Vaccination 08/12/2019  . Tdap 04/15/2017  . Zoster Recombinat (Shingrix) 12/05/2017, 03/19/2019    ***  No orders of the defined types were placed in this encounter.       Delsa Grana, PA-C 08/13/19 10:17 AM  [facility]

## 2019-08-14 ENCOUNTER — Other Ambulatory Visit: Payer: Self-pay | Admitting: Family Medicine

## 2019-08-14 LAB — COMPLETE METABOLIC PANEL WITH GFR
AG Ratio: 1.6 (calc) (ref 1.0–2.5)
ALT: 15 U/L (ref 6–29)
AST: 14 U/L (ref 10–35)
Albumin: 4.1 g/dL (ref 3.6–5.1)
Alkaline phosphatase (APISO): 46 U/L (ref 37–153)
BUN: 17 mg/dL (ref 7–25)
CO2: 28 mmol/L (ref 20–32)
Calcium: 9.2 mg/dL (ref 8.6–10.4)
Chloride: 107 mmol/L (ref 98–110)
Creat: 0.59 mg/dL (ref 0.50–0.99)
GFR, Est African American: 111 mL/min/{1.73_m2} (ref >=60)
GFR, Est Non African American: 96 mL/min/{1.73_m2} (ref >=60)
Globulin: 2.5 g/dL (calc) (ref 1.9–3.7)
Glucose, Bld: 100 mg/dL — ABNORMAL HIGH (ref 65–99)
Potassium: 4.4 mmol/L (ref 3.5–5.3)
Sodium: 142 mmol/L (ref 135–146)
Total Bilirubin: 0.6 mg/dL (ref 0.2–1.2)
Total Protein: 6.6 g/dL (ref 6.1–8.1)

## 2019-08-14 LAB — CBC WITH DIFFERENTIAL/PLATELET
Absolute Monocytes: 766 cells/uL (ref 200–950)
Basophils Absolute: 87 cells/uL (ref 0–200)
Basophils Relative: 1.1 %
Eosinophils Absolute: 324 cells/uL (ref 15–500)
Eosinophils Relative: 4.1 %
HCT: 45.8 % — ABNORMAL HIGH (ref 35.0–45.0)
Hemoglobin: 15.3 g/dL (ref 11.7–15.5)
Lymphs Abs: 3452 cells/uL (ref 850–3900)
MCH: 30.7 pg (ref 27.0–33.0)
MCHC: 33.4 g/dL (ref 32.0–36.0)
MCV: 91.8 fL (ref 80.0–100.0)
MPV: 9.4 fL (ref 7.5–12.5)
Monocytes Relative: 9.7 %
Neutro Abs: 3271 cells/uL (ref 1500–7800)
Neutrophils Relative %: 41.4 %
Platelets: 278 10*3/uL (ref 140–400)
RBC: 4.99 10*6/uL (ref 3.80–5.10)
RDW: 11.6 % (ref 11.0–15.0)
Total Lymphocyte: 43.7 %
WBC: 7.9 10*3/uL (ref 3.8–10.8)

## 2019-08-14 LAB — LIPID PANEL
Cholesterol: 150 mg/dL (ref <200)
HDL: 47 mg/dL — ABNORMAL LOW (ref >=50)
LDL Cholesterol (Calc): 80 mg/dL (calc)
Non-HDL Cholesterol (Calc): 103 mg/dL (calc) (ref <130)
Total CHOL/HDL Ratio: 3.2 (calc) (ref <5.0)
Triglycerides: 136 mg/dL (ref <150)

## 2019-08-14 LAB — HEMOGLOBIN A1C
Hgb A1c MFr Bld: 5.6 % of total Hgb (ref <5.7)
Mean Plasma Glucose: 114 (calc)
eAG (mmol/L): 6.3 (calc)

## 2019-08-14 LAB — VITAMIN D 25 HYDROXY (VIT D DEFICIENCY, FRACTURES): Vit D, 25-Hydroxy: 17 ng/mL — ABNORMAL LOW (ref 30–100)

## 2019-08-14 MED ORDER — VITAMIN D (ERGOCALCIFEROL) 1.25 MG (50000 UNIT) PO CAPS: 50000.0000 [IU] | ORAL_CAPSULE | ORAL | 0 refills | Status: DC

## 2019-08-19 ENCOUNTER — Encounter: Payer: Self-pay | Admitting: Family Medicine

## 2019-08-19 DIAGNOSIS — Z780 Asymptomatic menopausal state: Secondary | ICD-10-CM | POA: Insufficient documentation

## 2019-08-19 DIAGNOSIS — E559 Vitamin D deficiency, unspecified: Secondary | ICD-10-CM | POA: Insufficient documentation

## 2019-08-20 NOTE — Progress Notes (Signed)
Patient: Pamela Dixon, Female    DOB: 1956/01/27, 64 y.o.   MRN: [id number] Visit Date: 08/20/2019  Today's Provider: Delsa Grana, PA-C   Chief Complaint  Patient presents with  . Annual Exam   Subjective:   Initial preventative physical exam Pamela Dixon is a 64 y.o. female who presents today for her Initial Preventative Physical Exam and she also came for her CPE/routine f/up   She feels fairly well.  Exercise/Activity: started walking again  Diet/nutrition: Cooking at home, "not healthy" a lot of fried foods, potatoes and pasta  Sleep: Sleeps well   Gained weight over the past year due to COVID   HTN: Blood pressure has been high, but with med changes over the past couple months is now better controlled on hctz 25 mg, losartan 100 mg and metoprolol 25 mg, she is tolerating dose change, HCTZ was increased from 12.5 mg to 25 and has no SE or complaints  Pt reports good med compliance and denies any SE.  No lightheadedness, hypotension, syncope. Blood pressure today is well controlled. BP Readings from Last 3 Encounters:  08/13/19 132/84  05/21/19 124/84  04/21/19 (!) 151/88  Pt denies CP, SOB, exertional sx, LE edema, palpitation, Ha's, visual disturbances  Hyperlipidemia: Current Medication Regimen:  She is compliant with statin - had discussed with pcp taking 3 days a week, she's willing to take more, she has not had any SE  - Denies: Chest pain, shortness of breath, myalgias. - Documented aortic atherosclerosis? No - Risk factors for atherosclerosis: hypercholesterolemia and hypertension  Review of Systems  Constitutional: Negative. Negative for activity change, appetite change, fatigue and unexpected weight change.  HENT: Negative.  Eyes: Negative.  Respiratory: Negative. Negative for shortness of breath.  Cardiovascular: Negative. Negative for chest pain, palpitations and leg swelling.  Gastrointestinal: Negative. Negative for abdominal pain and blood in stool.   Endocrine: Negative.  Genitourinary: Negative.  Musculoskeletal: Negative. Negative for arthralgias, gait problem, joint swelling and myalgias.  Skin: Negative. Negative for color change, pallor and rash.  Allergic/Immunologic: Negative.  Neurological: Negative. Negative for syncope and weakness.  Hematological: Negative.  Psychiatric/Behavioral: Negative. Negative for confusion, dysphoric mood, self-injury and suicidal ideas. The patient is not nervous/anxious.   Depression screen [facility] 2/9 08/13/2019 04/21/2019 01/21/2019  Decreased Interest 0 0 0  Down, Depressed, Hopeless 0 0 0  PHQ - 2 Score 0 0 0  Altered sleeping 0 0 0  Tired, decreased energy 0 0 0  Change in appetite 0 0 0  Feeling bad or failure about yourself  0 0 0  Trouble concentrating 0 0 0  Moving slowly or fidgety/restless 0 0 0  Suicidal thoughts 0 0 0  PHQ-9 Score 0 0 0  Difficult doing work/chores Not difficult at all Not difficult at all Not difficult at all   Depression screen/phq9 done and neg, reviewed today, AUDIT neg   Office Visit from 08/13/2019 in [facility]  AUDIT-C Score  1       Social History   Socioeconomic History  . Marital status: Married    Spouse name: Broadus John  . Number of children: 2  . Years of education: 43  . Highest education level: High school graduate  Occupational History  . Not on file  Tobacco Use  . Smoking status: Never Smoker  . Smokeless tobacco: Never Used  Substance and Sexual Activity  . Alcohol use: No    Alcohol/week: 0.0 standard drinks  .  Drug use: No  . Sexual activity: Yes  Other Topics Concern  . Not on file  Social History Narrative  . Not on file   Social Determinants of Health   Financial Resource Strain:   . Difficulty of Paying Living Expenses: Not on file  Food Insecurity:   . Worried About Charity fundraiser in the Last Year: Not on file  . Ran Out of Food in the Last Year: Not on file  Transportation Needs:   . Lack of  Transportation (Medical): Not on file  . Lack of Transportation (Non-Medical): Not on file  Physical Activity:   . Days of Exercise per Week: Not on file  . Minutes of Exercise per Session: Not on file  Stress:   . Feeling of Stress : Not on file  Social Connections:   . Frequency of Communication with Friends and Family: Not on file  . Frequency of Social Gatherings with Friends and Family: Not on file  . Attends Religious Services: Not on file  . Active Member of Clubs or Organizations: Not on file  . Attends Archivist Meetings: Not on file  . Marital Status: Not on file  Intimate Partner Violence:   . Fear of Current or Ex-Partner: Not on file  . Emotionally Abused: Not on file  . Physically Abused: Not on file  . Sexually Abused: Not on file    Past Medical History:  Diagnosis Date  . Hypertension      Patient Active Problem List   Diagnosis Date Noted  . 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 Vitamin D deficiency 08/19/2019  . Mixed hyperlipidemia 04/21/2019  . Essential hypertension, benign 06/10/2018  . Class 3 severe obesity with serious comorbidity and body mass index (BMI) of 40.0 to 44.9 in adult [facility]) 06/10/2018    Past Surgical History:  Procedure Laterality Date  . ABDOMINAL HYSTERECTOMY  Early 90's  . COLONOSCOPY WITH PROPOFOL N/A 03/15/2017   Procedure: COLONOSCOPY WITH PROPOFOL;  Surgeon: Manya Silvas, MD;  Location: [facility] ENDOSCOPY;  Service: Endoscopy;  Laterality: N/A;  . [place]    Her family history includes Cancer in her father; Diabetes in her father. There is no history of Breast cancer.      Current Outpatient Medications:  .  aspirin (BAYER ASPIRIN EC LOW DOSE) 81 MG EC tablet, Take 1 tablet (81 mg total) by mouth daily., Disp: , Rfl:  .  atorvastatin (LIPITOR) 20 MG tablet, TAKE 1 TABLET ON MONDAY, WEDNESDAY, FRIDAYS., Disp: 10 tablet, Rfl: 0 .  Cholecalciferol (VITAMIN D) 50 MCG (2000 UT) CAPS, , Disp:  , Rfl:  .  Coenzyme Q10-Vitamin E (QUNOL ULTRA COQ10 PO), , Disp: , Rfl:  .  metoprolol succinate (TOPROL-XL) 25 MG 24 hr tablet, TAKE 1 TABLET BY MOUTH EVERY DAY, Disp: 90 tablet, Rfl: 1 .  losartan-hydrochlorothiazide (HYZAAR) 100-25 MG tablet, Take 1 tablet by mouth daily., Disp: 90 tablet, Rfl: 3 .  Vitamin D, Ergocalciferol, (DRISDOL) 1.25 MG (50000 UNIT) CAPS capsule, Take 1 capsule (50,000 Units total) by mouth every 7 (seven) days. x12 weeks., Disp: 12 capsule, Rfl: 0   Patient Care Team: Hubbard Hartshorn, FNP as PCP - General (Family Medicine)      Objective:   Vitals: BP 132/84   Pulse 92   Temp 98.5 F (36.9 C)   Resp 14   Ht 5\' 6"  (1.676 m)   Wt 262 lb 9.6 oz (119.1 kg)   SpO2 98%  BMI 42.38 kg/m   Physical Exam Vitals and nursing note reviewed.  Constitutional:      General: She is not in acute distress.    Appearance: Normal appearance. She is well-developed. She is obese. She is not ill-appearing, toxic-appearing or diaphoretic.     Interventions: Face mask in place.  HENT:     Head: Normocephalic and atraumatic.     Right Ear: External ear normal.     Left Ear: External ear normal.  Eyes:     General: Lids are normal. No scleral icterus.       Right eye: No discharge.        Left eye: No discharge.     Conjunctiva/sclera: Conjunctivae normal.  Neck:     Trachea: Phonation normal. No tracheal deviation.  Cardiovascular:     Rate and Rhythm: Normal rate and regular rhythm.     Pulses: Normal pulses.          Radial pulses are 2+ on the right side and 2+ on the left side.       Posterior tibial pulses are 2+ on the right side and 2+ on the left side.     Heart sounds: Normal heart sounds. No murmur. No friction rub. No gallop.   Pulmonary:     Effort: Pulmonary effort is normal. No respiratory distress.     Breath sounds: Normal breath sounds. No stridor. No wheezing, rhonchi or rales.  Chest:     Chest wall: No tenderness.  Abdominal:     General:  Bowel sounds are normal. There is no distension.     Palpations: Abdomen is soft.     Tenderness: There is no abdominal tenderness. There is no guarding or rebound.  Musculoskeletal:        General: No deformity. Normal range of motion.     Cervical back: Normal range of motion and neck supple.     Right lower leg: No edema.     Left lower leg: No edema.  Lymphadenopathy:     Cervical: No cervical adenopathy.  Skin:    General: Skin is warm and dry.     Capillary Refill: Capillary refill takes less than 2 seconds.     Coloration: Skin is not jaundiced or pale.     Findings: No rash.  Neurological:     Mental Status: She is alert and oriented to person, place, and time.     Motor: No abnormal muscle tone.     Gait: Gait normal.  Psychiatric:        Speech: Speech normal.        Behavior: Behavior normal.       Hearing Screening   125Hz  250Hz  500Hz  1000Hz  2000Hz  3000Hz  4000Hz  6000Hz  8000Hz   Right ear:   Pass Pass Pass  Pass    Left ear:   Pass Pass Pass  Pass      Visual Acuity Screening   Right eye Left eye Both eyes  Without correction:     With correction: 20/50 20/30 20/30     Activities of Daily Living In your present state of health, do you have any difficulty performing the following activities: 08/13/2019 01/21/2019  Hearing? N N  Vision? N N  Difficulty concentrating or making decisions? N N  Walking or climbing stairs? N N  Dressing or bathing? N N  Doing errands, shopping? N N  Preparing Food and eating ? N -  Using the Toilet? N -  In the past six months,  have you accidently leaked urine? N -  Do you have problems with loss of bowel control? N -  Managing your Medications? N -  Managing your Finances? N -  Some recent data might be hidden    Fall Risk Assessment Fall Risk  08/13/2019 04/21/2019 01/21/2019 10/21/2018 06/10/2018  Falls in the past year? 0 0 0 0 0  Number falls in past yr: 0 0 0 - -  Injury with Fall? 0 0 0 - -  Risk for fall due to : No Fall  Risks - - - -  Follow up Falls evaluation completed;Education provided;Falls prevention discussed Falls evaluation completed - - -     Depression Screen PHQ 2/9 Scores 08/13/2019 04/21/2019 01/21/2019 10/21/2018  PHQ - 2 Score 0 0 0 0  PHQ- 9 Score 0 0 0 0   Cognitive Function     6CIT Screen 08/13/2019  What Year? 0 points  What month? 0 points  What time? 0 points  Count back from 20 0 points  Months in reverse 0 points  Repeat phrase 0 points  Total Score 0    Advanced Directives: Does Patient Have a Medical Advance Directive?: No Would patient like information on creating a medical advance directive?: Yes (MAU/Ambulatory/Procedural Areas - Information given)    Assessment & Plan:     Initial Preventative Physical Exam  Reviewed patient's Family Medical History Reviewed and updated list of patient's medical providers Assessment of cognitive impairment was done Assessed patient's functional ability Established a written schedule for health screening services Health Risk Assessent Completed and Reviewed    USPSTF grade A and B recommendations reviewed with patient; age-appropriate recommendations, preventive care, screening tests, etc discussed and encouraged; healthy living encouraged; see AVS for patient education given to patient  Exercise Activities and Dietary recommendations  Discussed importance of 150 minutes of physical activity weekly, AHA exercise recommendations given to pt in AVS/handout   Discussed importance of healthy diet:  eating lean meats and proteins, avoiding trans fats and saturated fats, avoid simple sugars and excessive carbs in diet, eat 6 servings of fruit/vegetables daily and drink plenty of water and avoid sweet beverages.     Recommended pt to do annual eye exam and routine dental exams/cleanings   Depression, alcohol, fall screening completed as documented above and per flowsheets   Reviewed Health Maintenance:  Immunization  History  Administered Date(s) Administered  . Influenza Inj Mdck Quad Pf 03/31/2018  . Influenza,inj,Quad PF,6+ Mos 03/19/2019  . PFIZER SARS-COV-2 Vaccination 08/12/2019  . Tdap 04/15/2017  . Zoster Recombinat (Shingrix) 12/05/2017, 03/19/2019  Pt will defer pneumoccocal vaccination due to COVID vaccine   Health Maintenance  Topic Date Due  . DEXA SCAN  07/06/2019  . HIV Screening  08/12/2020 (Originally 07/05/1970)  . PNA vac Low Risk Adult (1 of 2 - PCV13) 08/12/2020 (Originally 07/06/2019)  . MAMMOGRAM  06/21/2020  . COLONOSCOPY  03/16/2027  . TETANUS/TDAP  04/16/2027  . INFLUENZA VACCINE  Completed  . Hepatitis C Screening  Completed     Discussed health benefits of physical activity, and encouraged her to engage in regular exercise appropriate for her age and condition.       ICD-10-CM   1. Encounter for Medicare annual wellness exam  Z00.00 DG Bone Density    Lipid panel    Hemoglobin A1c    EKG 12-Lead   IPPE done  2. Essential hypertension, benign  I10 losartan-hydrochlorothiazide (HYZAAR) 100-25 MG tablet    COMPLETE METABOLIC PANEL  WITH GFR   stable, well controlled, meds switched to combo pill with same dosing of losartan and HCTZ  3. Mixed hyperlipidemia  E78.2 Lipid panel    COMPLETE METABOLIC PANEL WITH GFR   Cholesterol has been well controlled, discussed continuing meds, increasing dose to daily if tolerated, work on diet and exercise as able  4. Screening for endocrine, metabolic and immunity disorder  Z13.29 Lipid panel   Z13.228 Hemoglobin A1c   [id number] COMPLETE METABOLIC PANEL WITH GFR    CBC with Differential/Platelet  5. Postmenopausal estrogen deficiency  Z78.0 DG Bone Density   due for dexa scan, discussed Vit D and calcium supplementation and weight bearing exercises for osteoporosis prevention  6. Screening for diabetes mellitus  Z13.1 Hemoglobin A1c  7. Class 3 severe obesity with serious comorbidity and body mass index (BMI) of 40.0 to 44.9 in  adult, unspecified obesity type (HCC)  E66.01 Lipid panel   Z68.41 Hemoglobin A1c    COMPLETE METABOLIC PANEL WITH GFR    CBC with Differential/Platelet   unfortunately some weight gain, offered nutritional referral, declined  8. Screening for heart disease  Z13.6 Lipid panel    Hemoglobin A1c    EKG 12-Lead  9. Vitamin D deficiency  E55.9 VITAMIN D 25 Hydroxy (Vit-D Deficiency, Fractures)  10. Need for pneumococcal vaccination  Z23    deferred until after covid vaccinations        Laurell Roof  [facility]

## 2019-08-21 ENCOUNTER — Encounter: Payer: Self-pay | Admitting: Family Medicine

## 2019-09-02 ENCOUNTER — Ambulatory Visit
Admission: RE | Admit: 2019-09-02 | Discharge: 2019-09-02 | Disposition: A | Discharge status: Home / Self Care | Payer: Medicare HMO | Source: Ambulatory Visit | Attending: Family Medicine | Admitting: Family Medicine

## 2019-09-02 DIAGNOSIS — Z0000 Encounter for general adult medical examination without abnormal findings: Secondary | ICD-10-CM | POA: Diagnosis present

## 2019-09-02 DIAGNOSIS — Z780 Asymptomatic menopausal state: Secondary | ICD-10-CM | POA: Insufficient documentation

## 2019-09-14 ENCOUNTER — Other Ambulatory Visit: Payer: Self-pay | Admitting: Family Medicine

## 2019-09-14 DIAGNOSIS — E782 Mixed hyperlipidemia: Secondary | ICD-10-CM

## 2019-09-14 NOTE — Telephone Encounter (Signed)
Per lab note- continue statin

## 2019-09-16 ENCOUNTER — Other Ambulatory Visit: Payer: Self-pay | Admitting: Family Medicine

## 2019-10-23 ENCOUNTER — Other Ambulatory Visit: Payer: Self-pay | Admitting: Family Medicine

## 2019-10-26 NOTE — Telephone Encounter (Signed)
Pt with Low Vit D  Pt has been on prescription vitamin D x 2 months, once finished she/he needs to take otc vitamin D 2000 units daily.  Refill not approved

## 2019-11-16 ENCOUNTER — Ambulatory Visit: Payer: Medicare HMO | Admitting: Family Medicine

## 2019-11-16 ENCOUNTER — Other Ambulatory Visit: Payer: Self-pay

## 2019-11-16 ENCOUNTER — Encounter: Payer: Self-pay | Admitting: Family Medicine

## 2019-11-16 VITALS — BP 128/82 | HR 97 | Temp 98.3°F | Resp 14 | Ht 66.0 in | Wt 262.2 lb

## 2019-11-16 DIAGNOSIS — E782 Mixed hyperlipidemia: Secondary | ICD-10-CM | POA: Diagnosis not present

## 2019-11-16 DIAGNOSIS — I10 Essential (primary) hypertension: Secondary | ICD-10-CM | POA: Diagnosis not present

## 2019-11-16 DIAGNOSIS — K219 Gastro-esophageal reflux disease without esophagitis: Secondary | ICD-10-CM

## 2019-11-16 DIAGNOSIS — E559 Vitamin D deficiency, unspecified: Secondary | ICD-10-CM | POA: Diagnosis not present

## 2019-11-16 DIAGNOSIS — M8589 Other specified disorders of bone density and structure, multiple sites: Secondary | ICD-10-CM | POA: Diagnosis not present

## 2019-11-16 MED ORDER — METOPROLOL SUCCINATE ER 25 MG PO TB24: 25.0000 mg | ORAL_TABLET | Freq: Every day | ORAL | 3 refills | Status: DC

## 2019-11-16 MED ORDER — LOSARTAN POTASSIUM-HCTZ 100-25 MG PO TABS: 1.0000 | ORAL_TABLET | Freq: Every day | ORAL | 3 refills | Status: DC

## 2019-11-16 NOTE — Progress Notes (Signed)
Name: Pamela Dixon   MRN: [id number]    DOB: October 03, 1955   Date:11/16/2019       Progress Note  Chief Complaint  Patient presents with  . Follow-up  . Hyperlipidemia  . Hypertension  . Medication Refill  . Gastroesophageal Reflux    having some and would to get on something     Subjective:   Pamela Dixon is a 64 y.o. female, presents to clinic for routine follow up on the conditions listed above.  HLD- lipitor 20 mg three times a week, doing well, no SE or myalgias Lab Results  Component Value Date   CHOL 150 08/13/2019   HDL 47 (L) 08/13/2019   LDLCALC 80 08/13/2019   TRIG 136 08/13/2019   CHOLHDL 3.2 08/13/2019   HTN - on losartan-HCTZ 100-25 and metoprolol 25  Compliant with meds, the pharmacy did not change her meds from individual to combo pill we sent to the pharmacy in March- not sure why -she has continued to take them individually at the same dose, not monitoring blood pressure at home but overall feels good.  She denies any lightheadedness or near syncope Blood pressure well controlled today BP Readings from Last 3 Encounters:  11/16/19 128/82  08/13/19 132/84  05/21/19 124/84  Pt denies CP, SOB, exertional sx, LE edema, palpitation, Ha's, visual disturbances  GERD -she is having some reflux symptoms would like to be prescribed medication to help.  Vitamin D deficiency-last vitamin D level was in March it was 17, she completed prescription strength weekly supplement and is currently doing over-the-counter vitamin D 2000 IU daily - wishes to wait longer to do any repeat labs.      Patient Active Problem List   Diagnosis Date Noted  . 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 Vitamin D deficiency 08/19/2019  . Mixed hyperlipidemia 04/21/2019  . Essential hypertension, benign 06/10/2018  . Class 3 severe obesity with serious comorbidity and body mass index (BMI) of 40.0 to 44.9 in adult [facility]) 06/10/2018    Past Surgical History:  Procedure  Laterality Date  . ABDOMINAL HYSTERECTOMY  Early 90's  . COLONOSCOPY WITH PROPOFOL N/A 03/15/2017   Procedure: COLONOSCOPY WITH PROPOFOL;  Surgeon: Manya Silvas, MD;  Location: [facility] ENDOSCOPY;  Service: Endoscopy;  Laterality: N/A;  . TUBAL LIGATION  1985    Family History  Problem Relation Age of Onset  . Diabetes Father   . Cancer Father        colon skin cancer to head  . Breast cancer Neg Hx     Social History   Tobacco Use  . Smoking status: Never Smoker  . Smokeless tobacco: Never Used  Substance Use Topics  . Alcohol use: No    Alcohol/week: 0.0 standard drinks  . Drug use: No      Current Outpatient Medications:  .  aspirin (BAYER ASPIRIN EC LOW DOSE) 81 MG EC tablet, Take 1 tablet (81 mg total) by mouth daily., Disp: , Rfl:  .  atorvastatin (LIPITOR) 20 MG tablet, TAKE 1 TABLET ON MONDAY, WEDNESDAY, FRIDAYS., Disp: 36 tablet, Rfl: 3 .  Cholecalciferol (VITAMIN D) 50 MCG (2000 UT) CAPS, , Disp: , Rfl:  .  Coenzyme Q10-Vitamin E (QUNOL ULTRA COQ10 PO), , Disp: , Rfl:  .  hydrochlorothiazide (HYDRODIURIL) 25 MG tablet, Take 25 mg by mouth daily., Disp: , Rfl:  .  losartan (COZAAR) 100 MG tablet, Take 100 mg by mouth daily., Disp: , Rfl:  .  metoprolol succinate (  TOPROL-XL) 25 MG 24 hr tablet, TAKE 1 TABLET BY MOUTH EVERY DAY, Disp: 90 tablet, Rfl: 1 .  losartan-hydrochlorothiazide (HYZAAR) 100-25 MG tablet, Take 1 tablet by mouth daily., Disp: 90 tablet, Rfl: 3 .  Vitamin D, Ergocalciferol, (DRISDOL) 1.25 MG (50000 UNIT) CAPS capsule, Take 1 capsule (50,000 Units total) by mouth every 7 (seven) days. x12 weeks. (Patient not taking: Reported on 11/16/2019), Disp: 12 capsule, Rfl: 0  No Known Allergies  Chart Review Today: I personally reviewed active problem list, medication list, allergies, family history, social history, health maintenance, notes from last encounter, lab results, imaging with the patient/caregiver today.   Review of Systems  10 Systems reviewed  and are negative for acute change except as noted in the HPI.    Objective:    Vitals:   11/16/19 0818  BP: 128/82  Pulse: 97  Resp: 14  Temp: 98.3 F (36.8 C)  SpO2: 98%  Weight: 262 lb 3.2 oz (118.9 kg)  Height: 5\' 6"  (1.676 m)    Body mass index is 42.32 kg/m.  Physical Exam Vitals and nursing note reviewed.  Constitutional:      General: She is not in acute distress.    Appearance: Normal appearance. She is well-developed. She is obese. She is not ill-appearing, toxic-appearing or diaphoretic.     Interventions: Face mask in place.  HENT:     Head: Normocephalic and atraumatic.     Right Ear: External ear normal.     Left Ear: External ear normal.  Eyes:     General: Lids are normal. No scleral icterus.       Right eye: No discharge.        Left eye: No discharge.     Conjunctiva/sclera: Conjunctivae normal.  Neck:     Trachea: Phonation normal. No tracheal deviation.  Cardiovascular:     Rate and Rhythm: Normal rate and regular rhythm.     Pulses: Normal pulses.          Radial pulses are 2+ on the right side and 2+ on the left side.       Posterior tibial pulses are 2+ on the right side and 2+ on the left side.     Heart sounds: Normal heart sounds. No murmur. No friction rub. No gallop.   Pulmonary:     Effort: Pulmonary effort is normal. No respiratory distress.     Breath sounds: Normal breath sounds. No stridor. No wheezing, rhonchi or rales.  Chest:     Chest wall: No tenderness.  Abdominal:     General: Bowel sounds are normal. There is no distension.     Palpations: Abdomen is soft.     Tenderness: There is no abdominal tenderness. There is no guarding or rebound.  Musculoskeletal:        General: No deformity. Normal range of motion.     Cervical back: Normal range of motion and neck supple.     Right lower leg: No edema.     Left lower leg: No edema.  Lymphadenopathy:     Cervical: No cervical adenopathy.  Skin:    General: Skin is warm and  dry.     Capillary Refill: Capillary refill takes less than 2 seconds.     Coloration: Skin is not jaundiced or pale.     Findings: No rash.  Neurological:     Mental Status: She is alert and oriented to person, place, and time.     Motor: No abnormal muscle  tone.     Gait: Gait normal.  Psychiatric:        Speech: Speech normal.        Behavior: Behavior normal.       PHQ2/9: Depression screen [facility] 2/9 11/16/2019 08/13/2019 04/21/2019 01/21/2019 10/21/2018  Decreased Interest 0 0 0 0 0  Down, Depressed, Hopeless 0 0 0 0 0  PHQ - 2 Score 0 0 0 0 0  Altered sleeping 0 0 0 0 0  Tired, decreased energy 0 0 0 0 0  Change in appetite 0 0 0 0 0  Feeling bad or failure about yourself  0 0 0 0 0  Trouble concentrating 0 0 0 0 0  Moving slowly or fidgety/restless 0 0 0 0 0  Suicidal thoughts 0 0 0 0 0  PHQ-9 Score 0 0 0 0 0  Difficult doing work/chores Not difficult at all Not difficult at all Not difficult at all Not difficult at all Not difficult at all    phq 9 is neg reviewed  Fall Risk: Fall Risk  11/16/2019 08/13/2019 04/21/2019 01/21/2019 10/21/2018  Falls in the past year? 0 0 0 0 0  Number falls in past yr: 0 0 0 0 -  Injury with Fall? 0 0 0 0 -  Risk for fall due to : - No Fall Risks - - -  Follow up - Falls evaluation completed;Education provided;Falls prevention discussed Falls evaluation completed - -    Functional Status Survey: Is the patient deaf or have difficulty hearing?: No Does the patient have difficulty seeing, even when wearing glasses/contacts?: No Does the patient have difficulty concentrating, remembering, or making decisions?: No Does the patient have difficulty walking or climbing stairs?: No Does the patient have difficulty dressing or bathing?: No Does the patient have difficulty doing errands alone such as visiting a doctor's office or shopping?: No   Assessment & Plan:   1. Essential hypertension, benign Stable, well controlled, labs recently done and  reviewed with the patient, good renal function and normal electrolytes - metoprolol succinate (TOPROL-XL) 25 MG 24 hr tablet; Take 1 tablet (25 mg total) by mouth daily.  Dispense: 90 tablet; Refill: 3 - losartan-hydrochlorothiazide (HYZAAR) 100-25 MG tablet; Take 1 tablet by mouth daily.  Dispense: 90 tablet; Refill: 3  2. Osteopenia of multiple sites Encourage patient to manage vitamin D with higher dose supplement in addition to calcium supplement and weightbearing exercises, DEXA done 3 months ago, reviewed today  3. Vitamin D deficiency Vitamin D low 3 months ago, she completed prescription strength weekly supplement and has transition to 2000 IU vitamin D daily over-the-counter.  She does not wish to check a right now but I explained options to check it in the next 3 to 6 months.  Though she does not have any change in symptoms with supplementation it would be important to know that it improved since she does have osteopenia and we would like to prevent any progression to osteoporosis  4. Mixed hyperlipidemia Tolerating statin 3 times a week per her last PCP,  encouraged her to try every other day, last lipid panel good- reviewed today, labs for lipid panel once per year unless changes or concerns.  We will monitor CMP/liver function more often  5. Gastroesophageal reflux disease, unspecified whether esophagitis present Patient given a handout on food choices for GERD and other lifestyle changes encouraged her to read through and work on diet changes, avoiding triggers, stop eating several hours before bedtime, elevate the  head of bed if needed Other instructions given on after visit summary: Start pepcid or zantac (famotadine or ranitidine) twice a day and if that and dietary/lifestyle changes below does not help improve your symptoms, then add omeprazole 20 mg twice a day or 40 mg once a day on an empty stomach.  Please send [place] a message if this does not improve your symptoms and we will  call in stronger prescription medicine.  Would like to avoid prolonged PPI use with osteopenia and symptoms currently sound mild.  Expect that she will be able to improve her symptoms with lifestyle changes and over-the-counter medications.  I did encourage her to follow-up if not improving and my plan would be to send in Protonix higher dose.  If prolonged use of Protonix did not improve symptoms or if she was unable to wean off I would refer to GI for further evaluation   Return for 1-2 months f/up if GI symptoms are not improving, 6 month f/up HTN/HLD.   Delsa Grana, PA-C 11/16/19 8:37 AM

## 2019-11-16 NOTE — Patient Instructions (Addendum)
Start pepcid or zantac (famotadine or ranitidine) twice a day and if that and dietary/lifestyle changes below does not help improve your symptoms, then add omeprazole 20 mg twice a day or 40 mg once a day on an empty stomach.  Please send [place] a message if this does not improve your symptoms and we will call in stronger prescription medicine.   Food Choices for Gastroesophageal Reflux Disease, Adult When you have gastroesophageal reflux disease (GERD), the foods you eat and your eating habits are very important. Choosing the right foods can help ease your discomfort. Think about working with a nutrition specialist (dietitian) to help you make good choices. What are tips for following this plan?  Meals  Choose healthy foods that are low in fat, such as fruits, vegetables, whole grains, low-fat dairy products, and lean meat, fish, and poultry.  Eat small meals often instead of 3 large meals a day. Eat your meals slowly, and in a place where you are relaxed. Avoid bending over or lying down until 2-3 hours after eating.  Avoid eating meals 2-3 hours before bed.  Avoid drinking a lot of liquid with meals.  Cook foods using methods other than frying. Bake, grill, or broil food instead.  Avoid or limit: ? Chocolate. ? Peppermint or spearmint. ? Alcohol. ? Pepper. ? Black and decaffeinated coffee. ? Black and decaffeinated tea. ? Bubbly (carbonated) soft drinks. ? Caffeinated energy drinks and soft drinks.  Limit high-fat foods such as: ? Fatty meat or fried foods. ? Whole milk, cream, butter, or ice cream. ? Nuts and nut butters. ? Pastries, donuts, and sweets made with butter or shortening.  Avoid foods that cause symptoms. These foods may be different for everyone. Common foods that cause symptoms include: ? Tomatoes. ? Oranges, lemons, and limes. ? Peppers. ? Spicy food. ? Onions and garlic. ? Vinegar. Lifestyle  Maintain a healthy weight. Ask your doctor what weight is  healthy for you. If you need to lose weight, work with your doctor to do so safely.  Exercise for at least 30 minutes for 5 or more days each week, or as told by your doctor.  Wear loose-fitting clothes.  Do not smoke. If you need help quitting, ask your doctor.  Sleep with the head of your bed higher than your feet. Use a wedge under the mattress or blocks under the bed frame to raise the head of the bed. Summary  When you have gastroesophageal reflux disease (GERD), food and lifestyle choices are very important in easing your symptoms.  Eat small meals often instead of 3 large meals a day. Eat your meals slowly, and in a place where you are relaxed.  Limit high-fat foods such as fatty meat or fried foods.  Avoid bending over or lying down until 2-3 hours after eating.  Avoid peppermint and spearmint, caffeine, alcohol, and chocolate. This information is not intended to replace advice given to you by your health care provider. Make sure you discuss any questions you have with your health care provider. Document Revised: 09/18/2018 Document Reviewed: 07/03/2016 Elsevier Patient Education  [employer].

## 2019-11-17 ENCOUNTER — Encounter: Payer: Self-pay | Admitting: Family Medicine

## 2020-01-22 ENCOUNTER — Other Ambulatory Visit: Payer: Self-pay | Admitting: Family Medicine

## 2020-04-20 NOTE — Progress Notes (Signed)
PCP: Delsa Grana, PA-C   Chief Complaint  Patient presents with  . Gynecologic Exam    Annual Exam    HPI:      Ms. Pamela Dixon is a 64 y.o. No obstetric history on file. whose LMP was No LMP recorded. Patient has had a hysterectomy., presents today for her NP> 3 yrs annual examination.  Her menses are absent due to menopause. No PMB.  She does not have vasomotor sx.   Sex activity: single partner, contraception - post menopausal status/hyst. She does not have vaginal dryness.  Last Pap: no longer indicated due to hyst. No hx of abn paps  Last mammogram: 06/22/19  Results were: normal--routine follow-up in 12 months There is no FH of breast cancer. There is no FH of ovarian cancer. The patient does not do self-breast exams.  Colonoscopy: 2018 with polyps with Dr. Vira Agar; Repeat due after ? 3  years. FH colon cancer in her dad. DEXA: 3/21 at [facility] with osteopenia in the spine and hip; with PCP  Tobacco use: The patient denies current or previous tobacco use. Alcohol use: none  No Drug use Exercise: moderately active  She does get adequate calcium and Vitamin D in her diet.  Labs with PCP.   Past Medical History:  Diagnosis Date  . High cholesterol   . Hypertension   . Mixed hyperlipidemia     Past Surgical History:  Procedure Laterality Date  . ABDOMINAL HYSTERECTOMY  Early 90's  . COLONOSCOPY WITH PROPOFOL N/A 03/15/2017   Procedure: COLONOSCOPY WITH PROPOFOL;  Surgeon: Manya Silvas, MD;  Location: [facility] ENDOSCOPY;  Service: Endoscopy;  Laterality: N/A;  . TUBAL LIGATION  1985    Family History  Problem Relation Age of Onset  . Diabetes Father   . Cancer Father        colon skin cancer to head  . Breast cancer Neg Hx     Social History   Socioeconomic History  . Marital status: Married    Spouse name: Broadus John  . Number of children: 2  . Years of education: 36  . Highest education level: High school graduate  Occupational History  . Not on file    Tobacco Use  . Smoking status: Never Smoker  . Smokeless tobacco: Never Used  Substance and Sexual Activity  . Alcohol use: No    Alcohol/week: 0.0 standard drinks  . Drug use: No  . Sexual activity: Yes  Other Topics Concern  . Not on file  Social History Narrative  . Not on file   Social Determinants of Health   Financial Resource Strain:   . Difficulty of Paying Living Expenses: Not on file  Food Insecurity:   . Worried About Charity fundraiser in the Last Year: Not on file  . Ran Out of Food in the Last Year: Not on file  Transportation Needs:   . Lack of Transportation (Medical): Not on file  . Lack of Transportation (Non-Medical): Not on file  Physical Activity:   . Days of Exercise per Week: Not on file  . Minutes of Exercise per Session: Not on file  Stress:   . Feeling of Stress : Not on file  Social Connections:   . Frequency of Communication with Friends and Family: Not on file  . Frequency of Social Gatherings with Friends and Family: Not on file  . Attends Religious Services: Not on file  . Active Member of Clubs or Organizations: Not on file  .  Attends Archivist Meetings: Not on file  . Marital Status: Not on file  Intimate Partner Violence:   . Fear of Current or Ex-Partner: Not on file  . Emotionally Abused: Not on file  . Physically Abused: Not on file  . Sexually Abused: Not on file     Current Outpatient Medications:  .  aspirin (BAYER ASPIRIN EC LOW DOSE) 81 MG EC tablet, Take 1 tablet (81 mg total) by mouth daily., Disp: , Rfl:  .  atorvastatin (LIPITOR) 20 MG tablet, TAKE 1 TABLET ON MONDAY, WEDNESDAY, FRIDAYS., Disp: 36 tablet, Rfl: 3 .  Coenzyme Q10-Vitamin E (QUNOL ULTRA COQ10 PO), , Disp: , Rfl:  .  losartan-hydrochlorothiazide (HYZAAR) 100-25 MG tablet, Take 1 tablet by mouth daily., Disp: 90 tablet, Rfl: 3 .  metoprolol succinate (TOPROL-XL) 25 MG 24 hr tablet, Take 1 tablet (25 mg total) by mouth daily., Disp: 90 tablet, Rfl:  3 .  Cholecalciferol (VITAMIN D) 50 MCG (2000 UT) CAPS, , Disp: , Rfl:      ROS:  Review of Systems  Constitutional: Negative for fatigue, fever and unexpected weight change.  Respiratory: Negative for cough, shortness of breath and wheezing.   Cardiovascular: Negative for chest pain, palpitations and leg swelling.  Gastrointestinal: Negative for blood in stool, constipation, diarrhea, nausea and vomiting.  Endocrine: Negative for cold intolerance, heat intolerance and polyuria.  Genitourinary: Negative for dyspareunia, dysuria, flank pain, frequency, genital sores, hematuria, menstrual problem, pelvic pain, urgency, vaginal bleeding, vaginal discharge and vaginal pain.  Musculoskeletal: Negative for back pain, joint swelling and myalgias.  Skin: Negative for rash.  Neurological: Negative for dizziness, syncope, light-headedness, numbness and headaches.  Hematological: Negative for adenopathy.  Psychiatric/Behavioral: Negative for agitation, confusion, sleep disturbance and suicidal ideas. The patient is not nervous/anxious.    BREAST: No symptoms    Objective: BP 140/85   Ht 5\' 8"  (1.727 m)   Wt 259 lb 3.2 oz (117.6 kg)   BMI 39.41 kg/m    Physical Exam Constitutional:      Appearance: She is well-developed.  Genitourinary:     Vulva, vagina, right adnexa and left adnexa normal.     No vaginal discharge, erythema or tenderness.     Cervix is absent.     Uterus is absent.     No right or left adnexal mass present.     Right adnexa not tender.     Left adnexa not tender.     Genitourinary Comments: UTERUS/CX SURG REM  Neck:     Thyroid: No thyromegaly.  Cardiovascular:     Rate and Rhythm: Normal rate and regular rhythm.     Heart sounds: Normal heart sounds. No murmur heard.   Pulmonary:     Effort: Pulmonary effort is normal.     Breath sounds: Normal breath sounds.  Chest:     Breasts:        Right: No mass, nipple discharge, skin change or tenderness.         Left: No mass, nipple discharge, skin change or tenderness.  Abdominal:     Palpations: Abdomen is soft.     Tenderness: There is no abdominal tenderness. There is no guarding.  Musculoskeletal:        General: Normal range of motion.     Cervical back: Normal range of motion.  Neurological:     General: No focal deficit present.     Mental Status: She is alert and oriented to person, place, and time.  Cranial Nerves: No cranial nerve deficit.  Skin:    General: Skin is warm and dry.  Psychiatric:        Mood and Affect: Mood normal.        Behavior: Behavior normal.        Thought Content: Thought content normal.        Judgment: Judgment normal.  Vitals reviewed.     Assessment/Plan:  Encounter for annual routine gynecological examination  Encounter for screening mammogram for malignant neoplasm of breast - Plan: MM 3D SCREEN BREAST BILATERAL; pt to sched mammo  Screening for colon cancer--pt to call [facility] in regards to when next colonoscopy due. Will do ref prn.           GYN counsel breast self exam, mammography screening, menopause, adequate intake of calcium and vitamin D, diet and exercise    F/U  Return in about 2 years (around 26/33/3545) for annual.  Daffney Greenly B. Elynn Patteson, PA-C 04/21/2020 10:53 AM

## 2020-04-21 ENCOUNTER — Ambulatory Visit (INDEPENDENT_AMBULATORY_CARE_PROVIDER_SITE_OTHER): Payer: Medicare HMO | Admitting: Obstetrics and Gynecology

## 2020-04-21 ENCOUNTER — Encounter: Payer: Self-pay | Admitting: Obstetrics and Gynecology

## 2020-04-21 ENCOUNTER — Other Ambulatory Visit: Payer: Self-pay

## 2020-04-21 VITALS — BP 140/85 | Ht 68.0 in | Wt 259.2 lb

## 2020-04-21 DIAGNOSIS — Z01419 Encounter for gynecological examination (general) (routine) without abnormal findings: Secondary | ICD-10-CM

## 2020-04-21 DIAGNOSIS — Z1231 Encounter for screening mammogram for malignant neoplasm of breast: Secondary | ICD-10-CM | POA: Diagnosis not present

## 2020-04-21 DIAGNOSIS — Z1211 Encounter for screening for malignant neoplasm of colon: Secondary | ICD-10-CM | POA: Diagnosis not present

## 2020-04-21 NOTE — Patient Instructions (Addendum)
I value your feedback and entrusting us with your care. If you get a [facility] patient survey, I would appreciate you taking the time to let us know about your experience today. Thank you! ° °As of May 21, 2019, your lab results will be released to your MyChart immediately, before I even have a chance to see them. Please give me time to review them and contact you if there are any abnormalities. Thank you for your patience.  ° °[facility]: [phone] ° ° ° °

## 2020-05-17 ENCOUNTER — Ambulatory Visit: Payer: Medicare HMO | Admitting: Family Medicine

## 2020-06-21 ENCOUNTER — Ambulatory Visit: Payer: Medicare HMO | Admitting: Family Medicine

## 2020-06-29 ENCOUNTER — Other Ambulatory Visit: Payer: Self-pay

## 2020-06-29 ENCOUNTER — Ambulatory Visit
Admission: RE | Admit: 2020-06-29 | Discharge: 2020-06-29 | Disposition: A | Discharge status: Home / Self Care | Payer: Medicare HMO | Source: Ambulatory Visit | Attending: Obstetrics and Gynecology | Admitting: Obstetrics and Gynecology

## 2020-06-29 DIAGNOSIS — Z1231 Encounter for screening mammogram for malignant neoplasm of breast: Secondary | ICD-10-CM | POA: Diagnosis not present

## 2020-07-12 ENCOUNTER — Ambulatory Visit: Payer: Medicare HMO | Admitting: Family Medicine

## 2020-08-01 ENCOUNTER — Ambulatory Visit: Payer: Medicare HMO | Admitting: Family Medicine

## 2020-08-16 ENCOUNTER — Ambulatory Visit: Payer: Medicare HMO

## 2020-08-22 ENCOUNTER — Telehealth: Payer: Self-pay | Admitting: Family Medicine

## 2020-08-22 NOTE — Telephone Encounter (Signed)
Copied from [place] [id number]. Topic: Medicare AWV >> Aug 22, 2020  3:24 PM Cher Nakai R wrote: Reason for CRM:  Left message for patient to call back and schedule Medicare Annual Wellness Visit (AWV) in office.   If unable to come into the office for AWV,  please offer to do virtually or by telephone.  Last AWV: 08/13/2019  Please schedule at anytime with [place].  40 minute appointment  Any questions, please contact me at [phone]

## 2020-08-29 ENCOUNTER — Encounter: Payer: Self-pay | Admitting: Family Medicine

## 2020-08-29 ENCOUNTER — Other Ambulatory Visit: Payer: Self-pay

## 2020-08-29 ENCOUNTER — Ambulatory Visit: Payer: Medicare HMO | Admitting: Family Medicine

## 2020-08-29 VITALS — BP 124/78 | HR 94 | Temp 98.0°F | Resp 16 | Ht 68.0 in | Wt 247.4 lb

## 2020-08-29 DIAGNOSIS — E782 Mixed hyperlipidemia: Secondary | ICD-10-CM

## 2020-08-29 DIAGNOSIS — Z23 Encounter for immunization: Secondary | ICD-10-CM | POA: Diagnosis not present

## 2020-08-29 DIAGNOSIS — M8589 Other specified disorders of bone density and structure, multiple sites: Secondary | ICD-10-CM

## 2020-08-29 DIAGNOSIS — I10 Essential (primary) hypertension: Secondary | ICD-10-CM | POA: Diagnosis not present

## 2020-08-29 DIAGNOSIS — Z6837 Body mass index (BMI) 37.0-37.9, adult: Secondary | ICD-10-CM

## 2020-08-29 DIAGNOSIS — Z5181 Encounter for therapeutic drug level monitoring: Secondary | ICD-10-CM

## 2020-08-29 DIAGNOSIS — E559 Vitamin D deficiency, unspecified: Secondary | ICD-10-CM | POA: Diagnosis not present

## 2020-08-29 DIAGNOSIS — K219 Gastro-esophageal reflux disease without esophagitis: Secondary | ICD-10-CM

## 2020-08-29 DIAGNOSIS — Z1211 Encounter for screening for malignant neoplasm of colon: Secondary | ICD-10-CM

## 2020-08-29 NOTE — Patient Instructions (Signed)
Blood Pressure Record Sheet  Blood pressure log Date: _______________________  a.m. _____________________(1st reading) _____________________(2nd reading)  p.m. _____________________(1st reading) _____________________(2nd reading) Date: _______________________  a.m. _____________________(1st reading) _____________________(2nd reading)  p.m. _____________________(1st reading) _____________________(2nd reading) Date: _______________________  a.m. _____________________(1st reading) _____________________(2nd reading)  p.m. _____________________(1st reading) _____________________(2nd reading) Date: _______________________  a.m. _____________________(1st reading) _____________________(2nd reading)  p.m. _____________________(1st reading) _____________________(2nd reading) Date: _______________________  a.m. _____________________(1st reading) _____________________(2nd reading)  p.m. _____________________(1st reading) _____________________(2nd reading) Date: _______________________  a.m. _____________________(1st reading) _____________________(2nd reading)  p.m. _____________________(1st reading) _____________________(2nd reading) Date: _______________________  a.m. _____________________(1st reading) _____________________(2nd reading)  p.m. _____________________(1st reading) _____________________(2nd reading) Date: _______________________  a.m. _____________________(1st reading) _____________________(2nd reading)  p.m. _____________________(1st reading) _____________________(2nd reading) Date: _______________________  a.m. _____________________(1st reading) _____________________(2nd reading)  p.m. _____________________(1st reading) _____________________(2nd reading) Date: _______________________  a.m. _____________________(1st reading) _____________________(2nd reading)  p.m. _____________________(1st reading) _____________________(2nd reading)       How to Take Your  Blood Pressure Blood pressure measures how strongly your blood is pressing against the walls of your arteries. Arteries are blood vessels that carry blood from your heart throughout your body. You can take your blood pressure at home with a machine. You may need to check your blood pressure at home:  To check if you have high blood pressure (hypertension).  To check your blood pressure over time.  To make sure your blood pressure medicine is working. Supplies needed:  Blood pressure machine, or monitor.  Dining room chair to sit in.  Table or desk.  Small notebook.  Pencil or pen. How to prepare Avoid these things for 30 minutes before checking your blood pressure:  Having drinks with caffeine in them, such as coffee or tea.  Drinking alcohol.  Eating.  Smoking.  Exercising. Do these things five minutes before checking your blood pressure:  Go to the bathroom and pee (urinate).  Sit in a dining chair. Do not sit in a soft couch or an armchair.  Be quiet. Do not talk. How to take your blood pressure Follow the instructions that came with your machine. If you have a digital blood pressure monitor, these may be the instructions: 1. Sit up straight. 2. Place your feet on the floor. Do not cross your ankles or legs. 3. Rest your left arm at the level of your heart. You may rest it on a table, desk, or chair. 4. Pull up your shirt sleeve. 5. Wrap the blood pressure cuff around the upper part of your left arm. The cuff should be 1 inch (2.5 cm) above your elbow. It is best to wrap the cuff around bare skin. 6. Fit the cuff snugly around your arm. You should be able to place only one finger between the cuff and your arm. 7. Place the cord so that it rests in the bend of your elbow. 8. Press the power button. 9. Sit quietly while the cuff fills with air and loses air. 10. Write down the numbers on the screen. 11. Wait 2-3 minutes and then repeat steps 1-10.   What do the  numbers mean? Two numbers make up your blood pressure. The first number is called systolic pressure. The second is called diastolic pressure. An example of a blood pressure reading is "120 over 80" (or 120/80). If you are an adult and do not have a medical condition, use this guide to find out if your blood pressure is normal: Normal  First number: below 120.  Second number: below 80. Elevated  First number: 120-129.  Second  number: below 80. Hypertension stage 1  First number: 130-139.  Second number: 80-89. Hypertension stage 2  First number: 140 or above.  Second number: 79 or above. Your blood pressure is above normal even if only the top or bottom number is above normal. Follow these instructions at home:  Check your blood pressure as often as your doctor tells you to.  Check your blood pressure at the same time every day.  Take your monitor to your next doctor's appointment. Your doctor will: ? Make sure you are using it correctly. ? Make sure it is working right.  Make sure you understand what your blood pressure numbers should be.  Tell your doctor if your medicine is causing side effects.  Keep all follow-up visits as told by your doctor. This is important. General tips:  You will need a blood pressure machine, or monitor. Your doctor can suggest a monitor. You can buy one at a drugstore or online. When choosing one: ? Choose one with an arm cuff. ? Choose one that wraps around your upper arm. Only one finger should fit between your arm and the cuff. ? Do not choose one that measures your blood pressure from your wrist or finger. Where to find more information American Heart Association: www.heart.org Contact a doctor if:  Your blood pressure keeps being high. Get help right away if:  Your first blood pressure number is higher than 180.  Your second blood pressure number is higher than 120. Summary  Check your blood pressure at the same time every  day.  Avoid caffeine, alcohol, smoking, and exercise for 30 minutes before checking your blood pressure.  Make sure you understand what your blood pressure numbers should be. This information is not intended to replace advice given to you by your health care provider. Make sure you discuss any questions you have with your health care provider. Document Revised: 05/22/2019 Document Reviewed: 05/22/2019 Elsevier Patient Education  2021 [employer].

## 2020-08-29 NOTE — Progress Notes (Signed)
Name: Pamela Dixon   MRN: [id number]    DOB: 02-16-1956   Date:08/29/2020       Progress Note  Chief Complaint  Patient presents with  . Hypertension  . Hyperlipidemia    6 month follow up     Subjective:   Pamela Dixon is a 65 y.o. female, presents to clinic for routine follow   Hyperlipidemia: Currently treated with lipitor 20 mg three times a week - asked pt to try every other day, pt reports good med compliance with 3 x daily - taking with CoQ10 seems to help her tolerate it Last Lipids: Lab Results  Component Value Date   CHOL 150 08/13/2019   HDL 47 (L) 08/13/2019   LDLCALC 80 08/13/2019   TRIG 136 08/13/2019   CHOLHDL 3.2 08/13/2019   - Denies: Chest pain, shortness of breath, myalgias, claudication  HTN - on losartan-HCTZ 100-25 and metoprolol 25  She does want to decrease her med doses if she can.  She has a cuff at home but not currently monitoring BP Readings from Last 3 Encounters:  08/29/20 124/78  04/21/20 140/85  11/16/19 128/82    Compliant with meds, the pharmacy did not change her meds from individual to combo pill we sent to the pharmacy in March- not sure why -she has continued to take them individually at the same dose, not monitoring blood pressure at home but overall feels good.  She denies any lightheadedness or near syncope Blood pressure well controlled today    BP Readings from Last 3 Encounters:  11/16/19 128/82  08/13/19 132/84  05/21/19 124/84  Pt denies CP, SOB, exertional sx, LE edema, palpitation, Ha's, visual disturbances   GERD -Sx improved with avoiding food triggers and pepcid OTC BID prn  she is having some reflux symptoms would like to be prescribed medication to help. Plan last June 2021: Patient given a handout on food choices for GERD and other lifestyle changes encouraged her to read through and work on diet changes, avoiding triggers, stop eating several hours before bedtime, elevate the head of bed if needed Other  instructions given on after visit summary: Start pepcid or zantac (famotadine or ranitidine) twice a day and if that and dietary/lifestyle changes below does not help improve your symptoms, then add omeprazole 20 mg twice a day or 40 mg once a day on an empty stomach.  Please send [place] a message if this does not improve your symptoms and we will call in stronger prescription medicine.  Would like to avoid prolonged PPI use with osteopenia and symptoms currently sound mild.  Expect that she will be able to improve her symptoms with lifestyle changes and over-the-counter medications.  I did encourage her to follow-up if not improving and my plan would be to send in Protonix higher dose.  If prolonged use of Protonix did not improve symptoms or if she was unable to wean off I would refer to GI for further evaluation  Vitamin D deficiency-  Currently on 1000 IU daily Last vitamin D Lab Results  Component Value Date   VD25OH 17 (L) 08/13/2019  Last year -  last vitamin D level was in March it was 17, she completed prescription strength weekly supplement and is currently doing over-the-counter vitamin D 2000 IU daily - wishes to wait longer to do any repeat labs.   She never came back to do labs, willing to do today  Anxious - nervous about being in office today  Overdue for GI f/up for colonoscopy - screening every 3 years, family hx of colon CA, due in 2021 - was seeing Tiffany Kocher at [facility] who has since retired    Current Outpatient Medications:  .  aspirin (BAYER ASPIRIN EC LOW DOSE) 81 MG EC tablet, Take 1 tablet (81 mg total) by mouth daily., Disp: , Rfl:  .  atorvastatin (LIPITOR) 20 MG tablet, TAKE 1 TABLET ON MONDAY, WEDNESDAY, FRIDAYS., Disp: 36 tablet, Rfl: 3 .  Coenzyme Q10-Vitamin E (QUNOL ULTRA COQ10 PO), , Disp: , Rfl:  .  losartan-hydrochlorothiazide (HYZAAR) 100-25 MG tablet, Take 1 tablet by mouth daily., Disp: 90 tablet, Rfl: 3 .  metoprolol succinate (TOPROL-XL) 25 MG  24 hr tablet, Take 1 tablet (25 mg total) by mouth daily., Disp: 90 tablet, Rfl: 3 .  Cholecalciferol (VITAMIN D) 50 MCG (2000 UT) CAPS, , Disp: , Rfl:   Patient Active Problem List   Diagnosis Date Noted  . 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 Vitamin D deficiency 08/19/2019  . Mixed hyperlipidemia 04/21/2019  . Essential hypertension, benign 06/10/2018  . Class 3 severe obesity with serious comorbidity and body mass index (BMI) of 40.0 to 44.9 in adult [facility]) 06/10/2018    Past Surgical History:  Procedure Laterality Date  . ABDOMINAL HYSTERECTOMY  Early 90's  . COLONOSCOPY WITH PROPOFOL N/A 03/15/2017   Procedure: COLONOSCOPY WITH PROPOFOL;  Surgeon: Manya Silvas, MD;  Location: [facility] ENDOSCOPY;  Service: Endoscopy;  Laterality: N/A;  . TUBAL LIGATION  1985    Family History  Problem Relation Age of Onset  . Diabetes Father   . Cancer Father        colon skin cancer to head  . Breast cancer Neg Hx     Social History   Tobacco Use  . Smoking status: Never Smoker  . Smokeless tobacco: Never Used  Substance Use Topics  . Alcohol use: No    Alcohol/week: 0.0 standard drinks  . Drug use: No     No Known Allergies  Health Maintenance  Topic Date Due  . MAMMOGRAM  06/29/2021  . PNA vac Low Risk Adult (2 of 2 - PPSV23) 08/29/2021  . COLONOSCOPY (Pts 45-69yrs Insurance coverage will need to be confirmed)  03/16/2027  . TETANUS/TDAP  04/16/2027  . INFLUENZA VACCINE  Completed  . DEXA SCAN  Completed  . COVID-19 Vaccine  Completed  . Hepatitis C Screening  Completed  . HPV VACCINES  Aged Out    Chart Review Today: I personally reviewed active problem list, medication list, allergies, family history, social history, health maintenance, notes from last encounter, lab results, imaging with the patient/caregiver today.   Review of Systems  Constitutional: Negative.   HENT: Negative.   Eyes: Negative.   Respiratory: Negative.  Negative for shortness of  breath.   Cardiovascular: Negative.  Negative for chest pain, palpitations and leg swelling.  Gastrointestinal: Negative.   Endocrine: Negative.   Genitourinary: Negative.   Musculoskeletal: Negative.   Skin: Negative.   Allergic/Immunologic: Negative.   Neurological: Negative.  Negative for dizziness, syncope, weakness and light-headedness.  Hematological: Negative.   Psychiatric/Behavioral: Negative.   All other systems reviewed and are negative.    Objective:   Vitals:   08/29/20 1021  BP: 124/78  Pulse: 94  Resp: 16  Temp: 98 F (36.7 C)  TempSrc: Oral  SpO2: 97%  Weight: 247 lb 6.4 oz (112.2 kg)  Height: 5\' 8"  (1.727 m)    Body mass index is  37.62 kg/m.  Physical Exam Vitals and nursing note reviewed.  Constitutional:      General: She is not in acute distress.    Appearance: Normal appearance. She is well-developed. She is obese. She is not ill-appearing, toxic-appearing or diaphoretic.     Interventions: Face mask in place.  HENT:     Head: Normocephalic and atraumatic.     Right Ear: External ear normal.     Left Ear: External ear normal.  Eyes:     General: Lids are normal. No scleral icterus.       Right eye: No discharge.        Left eye: No discharge.     Conjunctiva/sclera: Conjunctivae normal.  Neck:     Trachea: Phonation normal. No tracheal deviation.  Cardiovascular:     Rate and Rhythm: Normal rate and regular rhythm.     Pulses: Normal pulses.          Radial pulses are 2+ on the right side and 2+ on the left side.       Posterior tibial pulses are 2+ on the right side and 2+ on the left side.     Heart sounds: Normal heart sounds. No murmur heard. No friction rub. No gallop.   Pulmonary:     Effort: Pulmonary effort is normal. No respiratory distress.     Breath sounds: Normal breath sounds. No stridor. No wheezing, rhonchi or rales.  Chest:     Chest wall: No tenderness.  Abdominal:     General: Bowel sounds are normal. There is no  distension.     Palpations: Abdomen is soft.  Musculoskeletal:     Right lower leg: No edema.     Left lower leg: No edema.  Skin:    General: Skin is warm and dry.     Coloration: Skin is not jaundiced or pale.     Findings: No rash.  Neurological:     Mental Status: She is alert.     Motor: No abnormal muscle tone.     Gait: Gait normal.  Psychiatric:        Attention and Perception: Attention normal.        Mood and Affect: Affect normal. Mood is anxious. Mood is not depressed.        Speech: Speech normal.        Behavior: Behavior normal. Behavior is cooperative.         Assessment & Plan:   1. Essential hypertension, benign Well controlled - I suspect pt is even better controlled at home since she is extremely anxious here today Discussed BP monitoring at home, working on low salt and healthy diet, increase physical activity monitor BP and can possibly try half dose of losartan-HCTZ, goal to keep BP <130/80 Invited her to come back to clinic and do nurse visit with BP cuff Also can bring in or send in via mychart her BP log to check in and if she would like her meds adjusted without physically coming in (due to her anxiety) - COMPLETE METABOLIC PANEL WITH GFR  2. Osteopenia of multiple sites Continue Vit D supplement - goal to get >20, continue weight bearing exercise - COMPLETE METABOLIC PANEL WITH GFR - VITAMIN D 25 Hydroxy (Vit-D Deficiency, Fractures)  3. Vitamin D deficiency Pt never returned for recheck of labs, was low one year ago, she did high dose Rx supplement then was asked to do 2000 IU daily and recheck labs, no labs done, she  decreased dose to 1000 IU daily - recheck now  - COMPLETE METABOLIC PANEL WITH GFR - VITAMIN D 25 Hydroxy (Vit-D Deficiency, Fractures)  4. Mixed hyperlipidemia On statin 3x a week, tolerating - COMPLETE METABOLIC PANEL WITH GFR - Lipid panel  5. Gastroesophageal reflux disease, unspecified whether esophagitis present Well  controlled with diet changes and prn pepcid OTC  6. Class 3 severe obesity with serious comorbidity and body mass index (BMI) of 40.0 to 44.9 in adult, unspecified obesity type (HCC) Weight down 12 lbs, encouraged her to continue her efforts comorbidities HTN, HLD GERD, osteopenia Wt Readings from Last 5 Encounters:  08/29/20 247 lb 6.4 oz (112.2 kg)  04/21/20 259 lb 3.2 oz (117.6 kg)  11/16/19 262 lb 3.2 oz (118.9 kg)  08/13/19 262 lb 9.6 oz (119.1 kg)  10/21/18 254 lb 9.6 oz (115.5 kg)   BMI Readings from Last 5 Encounters:  08/29/20 37.62 kg/m  04/21/20 39.41 kg/m  11/16/19 42.32 kg/m  08/13/19 42.38 kg/m  10/21/18 41.09 kg/m    7. Encounter for medication monitoring - CBC with Differential/Platelet - COMPLETE METABOLIC PANEL WITH GFR - Lipid panel - VITAMIN D 25 Hydroxy (Vit-D Deficiency, Fractures)  8. Need for pneumococcal vaccination - Pneumococcal conjugate vaccine 13-valent  9. Screening for malignant neoplasm of colon F/up with [facility] - was due 2021 for 3 year f/up - Ambulatory referral to Gastroenterology - asked for [facility] new provider to do her f/up Health Maintenance  Topic Date Due  . COLONOSCOPY (Pts 45-51yrs Insurance coverage will need to be confirmed)  03/15/2020  . MAMMOGRAM  06/29/2021  . PNA vac Low Risk Adult (2 of 2 - PPSV23) 08/29/2021  . DEXA SCAN  09/01/2021  . TETANUS/TDAP  04/16/2027  . INFLUENZA VACCINE  Completed  . COVID-19 Vaccine  Completed  . Hepatitis C Screening  Completed  . HPV VACCINES  Aged Out        No follow-ups on file.   Delsa Grana, PA-C 08/29/20 11:13 AM

## 2020-08-30 LAB — CBC WITH DIFFERENTIAL/PLATELET
Absolute Monocytes: 621 cells/uL (ref 200–950)
Basophils Absolute: 69 cells/uL (ref 0–200)
Basophils Relative: 1 %
Eosinophils Absolute: 262 cells/uL (ref 15–500)
Eosinophils Relative: 3.8 %
HCT: 46.9 % — ABNORMAL HIGH (ref 35.0–45.0)
Hemoglobin: 15.6 g/dL — ABNORMAL HIGH (ref 11.7–15.5)
Lymphs Abs: 3195 cells/uL (ref 850–3900)
MCH: 30.8 pg (ref 27.0–33.0)
MCHC: 33.3 g/dL (ref 32.0–36.0)
MCV: 92.5 fL (ref 80.0–100.0)
MPV: 9.6 fL (ref 7.5–12.5)
Monocytes Relative: 9 %
Neutro Abs: 2753 cells/uL (ref 1500–7800)
Neutrophils Relative %: 39.9 %
Platelets: 305 10*3/uL (ref 140–400)
RBC: 5.07 10*6/uL (ref 3.80–5.10)
RDW: 11.8 % (ref 11.0–15.0)
Total Lymphocyte: 46.3 %
WBC: 6.9 10*3/uL (ref 3.8–10.8)

## 2020-08-30 LAB — COMPLETE METABOLIC PANEL WITH GFR
AG Ratio: 1.4 (calc) (ref 1.0–2.5)
ALT: 13 U/L (ref 6–29)
AST: 13 U/L (ref 10–35)
Albumin: 4.1 g/dL (ref 3.6–5.1)
Alkaline phosphatase (APISO): 49 U/L (ref 37–153)
BUN: 11 mg/dL (ref 7–25)
CO2: 27 mmol/L (ref 20–32)
Calcium: 9.5 mg/dL (ref 8.6–10.4)
Chloride: 105 mmol/L (ref 98–110)
Creat: 0.61 mg/dL (ref 0.50–0.99)
GFR, Est African American: 109 mL/min/{1.73_m2} (ref >=60)
GFR, Est Non African American: 94 mL/min/{1.73_m2} (ref >=60)
Globulin: 2.9 g/dL (calc) (ref 1.9–3.7)
Glucose, Bld: 103 mg/dL — ABNORMAL HIGH (ref 65–99)
Potassium: 4.1 mmol/L (ref 3.5–5.3)
Sodium: 143 mmol/L (ref 135–146)
Total Bilirubin: 0.5 mg/dL (ref 0.2–1.2)
Total Protein: 7 g/dL (ref 6.1–8.1)

## 2020-08-30 LAB — LIPID PANEL
Cholesterol: 193 mg/dL (ref <200)
HDL: 44 mg/dL — ABNORMAL LOW (ref >=50)
LDL Cholesterol (Calc): 120 mg/dL (calc) — ABNORMAL HIGH
Non-HDL Cholesterol (Calc): 149 mg/dL (calc) — ABNORMAL HIGH (ref <130)
Total CHOL/HDL Ratio: 4.4 (calc) (ref <5.0)
Triglycerides: 177 mg/dL — ABNORMAL HIGH (ref <150)

## 2020-08-30 LAB — VITAMIN D 25 HYDROXY (VIT D DEFICIENCY, FRACTURES): Vit D, 25-Hydroxy: 28 ng/mL — ABNORMAL LOW (ref 30–100)

## 2020-09-07 ENCOUNTER — Other Ambulatory Visit: Payer: Self-pay

## 2020-09-07 DIAGNOSIS — E782 Mixed hyperlipidemia: Secondary | ICD-10-CM

## 2020-09-07 MED ORDER — ATORVASTATIN CALCIUM 20 MG PO TABS: ORAL_TABLET | ORAL | 3 refills | Status: DC

## 2020-09-20 ENCOUNTER — Ambulatory Visit (INDEPENDENT_AMBULATORY_CARE_PROVIDER_SITE_OTHER): Payer: Medicare HMO

## 2020-09-20 DIAGNOSIS — Z0000 Encounter for general adult medical examination without abnormal findings: Secondary | ICD-10-CM

## 2020-09-20 NOTE — Patient Instructions (Signed)
Pamela Dixon , Thank you for taking time to come for your Medicare Wellness Visit. I appreciate your ongoing commitment to your health goals. Please review the following plan we discussed and let me know if I can assist you in the future.   Screening recommendations/referrals: Colonoscopy: done 03/15/17. Due for repeat screening colonoscopy. Scheduled for consult with gastroenterology on 10/28/20 Mammogram: done 06/29/20 Bone Density: done 09/02/19 Recommended yearly ophthalmology/optometry visit for glaucoma screening and checkup Recommended yearly dental visit for hygiene and checkup  Vaccinations: Influenza vaccine: done 03/02/20 Pneumococcal vaccine: done 08/29/20 Tdap vaccine: done 04/15/17 Shingles vaccine: done 12/05/17 & 03/19/19   Covid-19:done 08/12/19, 09/02/19 & 06/29/20  Advanced directives: Advance directive discussed with you today. I have provided a copy for you to complete at home and have notarized. Once this is complete please bring a copy in to our office so we can scan it into your chart.  Conditions/risks identified:  Recommend drinking 6-8 glasses of water per day   Next appointment: Follow up in one year for your annual wellness visit    Preventive Care 65 Years and Older, Female Preventive care refers to lifestyle choices and visits with your health care provider that can promote health and wellness. What does preventive care include?  A yearly physical exam. This is also called an annual well check.  Dental exams once or twice a year.  Routine eye exams. Ask your health care provider how often you should have your eyes checked.  Personal lifestyle choices, including:  Daily care of your teeth and gums.  Regular physical activity.  Eating a healthy diet.  Avoiding tobacco and drug use.  Limiting alcohol use.  Practicing safe sex.  Taking low-dose aspirin every day.  Taking vitamin and mineral supplements as recommended by your health care provider. What  happens during an annual well check? The services and screenings done by your health care provider during your annual well check will depend on your age, overall health, lifestyle risk factors, and family history of disease. Counseling  Your health care provider may ask you questions about your:  Alcohol use.  Tobacco use.  Drug use.  Emotional well-being.  Home and relationship well-being.  Sexual activity.  Eating habits.  History of falls.  Memory and ability to understand (cognition).  Work and work Statistician.  Reproductive health. Screening  You may have the following tests or measurements:  Height, weight, and BMI.  Blood pressure.  Lipid and cholesterol levels. These may be checked every 5 years, or more frequently if you are over 43 years old.  Skin check.  Lung cancer screening. You may have this screening every year starting at age 86 if you have a 30-pack-year history of smoking and currently smoke or have quit within the past 15 years.  Fecal occult blood test (FOBT) of the stool. You may have this test every year starting at age 33.  Flexible sigmoidoscopy or colonoscopy. You may have a sigmoidoscopy every 5 years or a colonoscopy every 10 years starting at age 54.  Hepatitis C blood test.  Hepatitis B blood test.  Sexually transmitted disease (STD) testing.  Diabetes screening. This is done by checking your blood sugar (glucose) after you have not eaten for a while (fasting). You may have this done every 1-3 years.  Bone density scan. This is done to screen for osteoporosis. You may have this done starting at age 53.  Mammogram. This may be done every 1-2 years. Talk to your health care  provider about how often you should have regular mammograms. Talk with your health care provider about your test results, treatment options, and if necessary, the need for more tests. Vaccines  Your health care provider may recommend certain vaccines, such  as:  Influenza vaccine. This is recommended every year.  Tetanus, diphtheria, and acellular pertussis (Tdap, Td) vaccine. You may need a Td booster every 10 years.  Zoster vaccine. You may need this after age 45.  Pneumococcal 13-valent conjugate (PCV13) vaccine. One dose is recommended after age 64.  Pneumococcal polysaccharide (PPSV23) vaccine. One dose is recommended after age 24. Talk to your health care provider about which screenings and vaccines you need and how often you need them. This information is not intended to replace advice given to you by your health care provider. Make sure you discuss any questions you have with your health care provider. Document Released: 06/24/2015 Document Revised: 02/15/2016 Document Reviewed: 03/29/2015 Elsevier Interactive Patient Education  2017 [place] Prevention in the Home Falls can cause injuries. They can happen to people of all ages. There are many things you can do to make your home safe and to help prevent falls. What can I do on the outside of my home?  Regularly fix the edges of walkways and driveways and fix any cracks.  Remove anything that might make you trip as you walk through a door, such as a raised step or threshold.  Trim any bushes or trees on the path to your home.  Use bright outdoor lighting.  Clear any walking paths of anything that might make someone trip, such as rocks or tools.  Regularly check to see if handrails are loose or broken. Make sure that both sides of any steps have handrails.  Any raised decks and porches should have guardrails on the edges.  Have any leaves, snow, or ice cleared regularly.  Use sand or salt on walking paths during winter.  Clean up any spills in your garage right away. This includes oil or grease spills. What can I do in the bathroom?  Use night lights.  Install grab bars by the toilet and in the tub and shower. Do not use towel bars as grab bars.  Use  non-skid mats or decals in the tub or shower.  If you need to sit down in the shower, use a plastic, non-slip stool.  Keep the floor dry. Clean up any water that spills on the floor as soon as it happens.  Remove soap buildup in the tub or shower regularly.  Attach bath mats securely with double-sided non-slip rug tape.  Do not have throw rugs and other things on the floor that can make you trip. What can I do in the bedroom?  Use night lights.  Make sure that you have a light by your bed that is easy to reach.  Do not use any sheets or blankets that are too big for your bed. They should not hang down onto the floor.  Have a firm chair that has side arms. You can use this for support while you get dressed.  Do not have throw rugs and other things on the floor that can make you trip. What can I do in the kitchen?  Clean up any spills right away.  Avoid walking on wet floors.  Keep items that you use a lot in easy-to-reach places.  If you need to reach something above you, use a strong step stool that has a grab bar.  Keep electrical cords out of the way.  Do not use floor polish or wax that makes floors slippery. If you must use wax, use non-skid floor wax.  Do not have throw rugs and other things on the floor that can make you trip. What can I do with my stairs?  Do not leave any items on the stairs.  Make sure that there are handrails on both sides of the stairs and use them. Fix handrails that are broken or loose. Make sure that handrails are as long as the stairways.  Check any carpeting to make sure that it is firmly attached to the stairs. Fix any carpet that is loose or worn.  Avoid having throw rugs at the top or bottom of the stairs. If you do have throw rugs, attach them to the floor with carpet tape.  Make sure that you have a light switch at the top of the stairs and the bottom of the stairs. If you do not have them, ask someone to add them for you. What  else can I do to help prevent falls?  Wear shoes that:  Do not have high heels.  Have rubber bottoms.  Are comfortable and fit you well.  Are closed at the toe. Do not wear sandals.  If you use a stepladder:  Make sure that it is fully opened. Do not climb a closed stepladder.  Make sure that both sides of the stepladder are locked into place.  Ask someone to hold it for you, if possible.  Clearly mark and make sure that you can see:  Any grab bars or handrails.  First and last steps.  Where the edge of each step is.  Use tools that help you move around (mobility aids) if they are needed. These include:  Canes.  Walkers.  Scooters.  Crutches.  Turn on the lights when you go into a dark area. Replace any light bulbs as soon as they burn out.  Set up your furniture so you have a clear path. Avoid moving your furniture around.  If any of your floors are uneven, fix them.  If there are any pets around you, be aware of where they are.  Review your medicines with your doctor. Some medicines can make you feel dizzy. This can increase your chance of falling. Ask your doctor what other things that you can do to help prevent falls. This information is not intended to replace advice given to you by your health care provider. Make sure you discuss any questions you have with your health care provider. Document Released: 03/24/2009 Document Revised: 11/03/2015 Document Reviewed: 07/02/2014 Elsevier Interactive Patient Education  2017 [employer].

## 2020-09-20 NOTE — Progress Notes (Signed)
Subjective:   Pamela Dixon is a 65 y.o. female who presents for  Initial Medicare Annual preventive examination.  Virtual Visit via Telephone Note  I connected with  Pamela Dixon on 09/20/20 at  2:50 PM EDT by telephone and verified that I am speaking with the correct person using two identifiers.  Location: Patient: home Provider: [place] Persons participating in the virtual visit: [place]   I discussed the limitations, risks, security and privacy concerns of performing an evaluation and management service by telephone and the availability of in person appointments. The patient expressed understanding and agreed to proceed.  Interactive audio and video telecommunications were attempted between this nurse and patient, however failed, due to patient having technical difficulties OR patient did not have access to video capability.  We continued and completed visit with audio only.  Some vital signs may be absent or patient reported.   Clemetine Marker, LPN    Review of Systems     Cardiac Risk Factors include: advanced age (>53men, >52 women);hypertension     Objective:    There were no vitals filed for this visit. There is no height or weight on file to calculate BMI.  Advanced Directives 09/20/2020 08/19/2019 03/15/2017  Does Patient Have a Medical Advance Directive? No No No  Would patient like information on creating a medical advance directive? Yes (MAU/Ambulatory/Procedural Areas - Information given) Yes (MAU/Ambulatory/Procedural Areas - Information given) -    Current Medications (verified) Outpatient Encounter Medications as of 09/20/2020  Medication Sig  . aspirin (BAYER ASPIRIN EC LOW DOSE) 81 MG EC tablet Take 1 tablet (81 mg total) by mouth daily.  Marland Kitchen atorvastatin (LIPITOR) 20 MG tablet Take every other day 4X a week  . cholecalciferol (VITAMIN D3) 25 MCG (1000 UNIT) tablet 1 each daily.  . Coenzyme Q10-Vitamin E (QUNOL ULTRA COQ10 PO)   .  losartan-hydrochlorothiazide (HYZAAR) 100-25 MG tablet Take 1 tablet by mouth daily.  . metoprolol succinate (TOPROL-XL) 25 MG 24 hr tablet Take 1 tablet (25 mg total) by mouth daily.   No facility-administered encounter medications on file as of 09/20/2020.    Allergies (verified) Patient has no known allergies.   History: Past Medical History:  Diagnosis Date  . High cholesterol   . Hypertension   . Mixed hyperlipidemia    Past Surgical History:  Procedure Laterality Date  . ABDOMINAL HYSTERECTOMY  Early 90's  . COLONOSCOPY WITH PROPOFOL N/A 03/15/2017   Procedure: COLONOSCOPY WITH PROPOFOL;  Surgeon: Manya Silvas, MD;  [facility] END[facility]COPY;  Service: Endoscopy;  Laterality: N/A;  . TUBAL LIGATION  1985   Family History  Problem Relation Age of Onset  . Diabetes Father   . Cancer Father        colon skin cancer to head  . Breast cancer Neg Hx    Social History   Socioeconomic History  . Marital status: Married    Spouse name: Broadus John  . Number of children: 2  . Years of education: 55  . Highest education level: High school graduate  Occupational History  . Not on file  Tobacco Use  . Smoking status: Never Smoker  . Smokeless tobacco: Never Used  Substance and Sexual Activity  . Alcohol use: No    Alcohol/week: 0.0 standard drinks  . Drug use: No  . Sexual activity: Yes  Other Topics Concern  . Not on file  Social History Narrative  . Not on file   Social Determinants of Health  Financial Resource Strain: Low Risk   . Difficulty of Paying Living Expenses: Not hard at all  Food Insecurity: No Food Insecurity  . Worried About Charity fundraiser in the Last Year: Never true  . Ran Out of Food in the Last Year: Never true  Transportation Needs: No Transportation Needs  . Lack of Transportation (Medical): No  . Lack of Transportation (Non-Medical): No  Physical Activity: Insufficiently Active  . Days of Exercise per Week: 4 days  . Minutes of  Exercise per Session: 20 min  Stress: No Stress Concern Present  . Feeling of Stress : Not at all  Social Connections: Moderately Integrated  . Frequency of Communication with Friends and Family: More than three times a week  . Frequency of Social Gatherings with Friends and Family: Twice a week  . Attends Religious Services: More than 4 times per year  . Active Member of Clubs or Organizations: No  . Attends Archivist Meetings: Never  . Marital Status: Married    Tobacco Counseling Counseling given: Not Answered   Clinical Intake:  Pre-visit preparation completed: Yes  Pain : No/denies pain     Nutritional Risks: None Diabetes: No  How often do you need to have someone help you when you read instructions, pamphlets, or other written materials from your doctor or pharmacy?: 1 - Never    Interpreter Needed?: No  Information entered by :: Clemetine Marker LPN   Activities of Daily Living In your present state of health, do you have any difficulty performing the following activities: 09/20/2020 08/29/2020  Hearing? N N  Comment declines hearing aids -  Vision? N N  Difficulty concentrating or making decisions? N N  Walking or climbing stairs? N N  Dressing or bathing? N N  Doing errands, shopping? N N  Preparing Food and eating ? N -  Using the Toilet? N -  In the past six months, have you accidently leaked urine? N -  Do you have problems with loss of bowel control? N -  Managing your Medications? N -  Managing your Finances? N -  Housekeeping or managing your Housekeeping? N -  Some recent data might be hidden    Patient Care Team: Delsa Grana, PA-C as PCP - General (Family Medicine)  Indicate any recent Medical Services you may have received from other than [facility] providers in the past year (date may be approximate).     Assessment:   This is a routine wellness examination for Pamela Dixon.  Hearing/Vision screen  Hearing Screening   125Hz  250Hz  500Hz   1000Hz  2000Hz  3000Hz  4000Hz  6000Hz  8000Hz   Right ear:           Left ear:           Comments: Pt denies hearing difficulty  Vision Screening Comments: Annual vision screenings done at [place] issues and exercise activities discussed: Current Exercise Habits: Home exercise routine, Type of exercise: walking, Time (Minutes): 20, Frequency (Times/Week): 4, Weekly Exercise (Minutes/Week): 80, Intensity: Moderate, Exercise limited by: None identified  Goals    . DIET - INCREASE WATER INTAKE     Recommend drinking 6-8 glasses of water per day       Depression Screen PHQ 2/9 Scores 09/20/2020 08/29/2020 11/16/2019 08/13/2019 04/21/2019 01/21/2019 10/21/2018  PHQ - 2 Score 0 0 0 0 0 0 0  PHQ- 9 Score - - 0 0 0 0 0    Fall Risk Fall Risk  09/20/2020 08/29/2020 11/16/2019 08/13/2019 04/21/2019  Falls in the past year? 0 0 0 0 0  Number falls in past yr: 0 0 0 0 0  Injury with Fall? 0 0 0 0 0  Risk for fall due to : No Fall Risks - - No Fall Risks -  Follow up Falls prevention discussed Falls evaluation completed - Falls evaluation completed;Education provided;Falls prevention discussed Falls evaluation completed    FALL RISK PREVENTION PERTAINING TO THE HOME:  Any stairs in or around the home? Yes  If so, are there any without handrails? No  Home free of loose throw rugs in walkways, pet beds, electrical cords, etc? Yes  Adequate lighting in your home to reduce risk of falls? Yes   ASSISTIVE DEVICES UTILIZED TO PREVENT FALLS:  Life alert? No  Use of a cane, walker or w/c? No  Grab bars in the bathroom? Yes  Shower chair or bench in shower? Yes  Elevated toilet seat or a handicapped toilet? No   TIMED UP AND GO:  Was the test performed? No . Telephonic visit.    Cognitive Function: Normal cognitive status assessed by direct observation by this Nurse Health Advisor. No abnormalities found.        6CIT Screen 08/13/2019  What Year? 0 points  What month? 0 points  What time? 0  points  Count back from 20 0 points  Months in reverse 0 points  Repeat phrase 0 points  Total Score 0    Immunizations Immunization History  Administered Date(s) Administered  . Influenza Inj Mdck Quad Pf 03/31/2018  . Influenza,inj,Quad PF,6+ Mos 03/19/2019  . Influenza-Unspecified 03/02/2020  . PFIZER(Purple Top)SARS-COV-2 Vaccination 08/12/2019, 09/02/2019, 06/29/2020  . Pneumococcal Conjugate-13 08/29/2020  . Tdap 04/15/2017  . Zoster Recombinat (Shingrix) 12/05/2017, 03/19/2019    TDAP status: Up to date  Flu Vaccine status: Up to date  Pneumococcal vaccine status: Up to date  Covid-19 vaccine status: Completed vaccines  Qualifies for Shingles Vaccine? Yes   Zostavax completed No   Shingrix Completed?: Yes  Screening Tests Health Maintenance  Topic Date Due  . COLONOSCOPY (Pts 45-76yrs Insurance coverage will need to be confirmed)  03/15/2020  . INFLUENZA VACCINE  01/09/2021  . MAMMOGRAM  06/29/2021  . PNA vac Low Risk Adult (2 of 2 - PPSV23) 08/29/2021  . DEXA SCAN  09/01/2021  . TETANUS/TDAP  04/16/2027  . COVID-19 Vaccine  Completed  . Hepatitis C Screening  Completed  . HPV VACCINES  Aged Out    Health Maintenance  Health Maintenance Due  Topic Date Due  . COLONOSCOPY (Pts 45-26yrs Insurance coverage will need to be confirmed)  03/15/2020    Colorectal cancer screening: Type of screening: Colonoscopy. Completed 03/15/17. Repeat every 3 years. Scheduled for consult with gastroenterology at [facility] on 10/28/20.  Mammogram status: Completed 06/29/20. Repeat every year  Bone Density status: Completed 09/02/19. Results reflect: Bone density results: OSTEOPENIA. Repeat every 2 years.  Lung Cancer Screening: (Low Dose CT Chest recommended if Age 35-80 years, 30 pack-year currently smoking OR have quit w/in 15years.) does not qualify.   Additional Screening:  Hepatitis C Screening: does qualify; Completed 03/14/16  Vision Screening: Recommended  annual ophthalmology exams for early detection of glaucoma and other disorders of the eye. Is the patient up to date with their annual eye exam?  Yes  Who is the provider or what is the name of the office in which the patient attends annual eye exams? [facility]  Dental Screening: Recommended annual dental exams for proper oral  hygiene  Community Resource Referral / Chronic Care Management: CRR required this visit?  No   CCM required this visit?  No      Plan:     I have personally reviewed and noted the following in the patient's chart:   . Medical and social history . Use of alcohol, tobacco or illicit drugs  . Current medications and supplements . Functional ability and status . Nutritional status . Physical activity . Advanced directives . List of other physicians . Hospitalizations, surgeries, and ER visits in previous 12 months . Vitals . Screenings to include cognitive, depression, and falls . Referrals and appointments  In addition, I have reviewed and discussed with patient certain preventive protocols, quality metrics, and best practice recommendations. A written personalized care plan for preventive services as well as general preventive health recommendations were provided to patient.     Clemetine Marker, LPN   [id number]   Nurse Notes: none

## 2020-10-28 LAB — HM COLONOSCOPY

## 2020-12-12 ENCOUNTER — Other Ambulatory Visit: Payer: Self-pay | Admitting: Family Medicine

## 2020-12-12 DIAGNOSIS — I10 Essential (primary) hypertension: Secondary | ICD-10-CM

## 2021-01-05 ENCOUNTER — Ambulatory Visit: Payer: Self-pay | Admitting: *Deleted

## 2021-01-05 NOTE — Telephone Encounter (Signed)
Patient is calling to report she started having symptoms of cough and congestion this morning. Patient states she did home test and she is + COVID. Patient advised per COVID protocols on treatment, isolation and emergency care. Patient is in a moderate risk category- will send note to office for PCP review and possible treatment options.

## 2021-01-05 NOTE — Telephone Encounter (Signed)
Reached out to pt and she seems to be doing fine and will call back if she needs an appt

## 2021-01-05 NOTE — Telephone Encounter (Signed)
Pt tested positive for covid.  She wants to know what she should do?  How long quarantine? What can she take?  She has stuffy head, cough, body aches. No fever yet.  Please advise  Reason for Disposition  [1] HIGH RISK for severe COVID complications (e.g., weak immune system, age > 24 years, obesity with BMI > 25, pregnant, chronic lung disease or other chronic medical condition) AND [2] COVID symptoms (e.g., cough, fever)  (Exceptions: Already seen by PCP and no new or worsening symptoms.)  Answer Assessment - Initial Assessment Questions 1. COVID-19 DIAGNOSIS: "Who made your COVID-19 diagnosis?" "Was it confirmed by a positive lab test or self-test?" If not diagnosed by a doctor (or NP/PA), ask "Are there lots of cases (community spread) where you live?" Note: See public health department website, if unsure.     Home test- this morning 2. COVID-19 EXPOSURE: "Was there any known exposure to COVID before the symptoms began?" CDC Definition of close contact: within 6 feet (2 meters) for a total of 15 minutes or more over a 24-hour period.      unknown 3. ONSET: "When did the COVID-19 symptoms start?"      This morning 4. WORST SYMPTOM: "What is your worst symptom?" (e.g., cough, fever, shortness of breath, muscle aches)    Congestion in head 5. COUGH: "Do you have a cough?" If Yes, ask: "How bad is the cough?"       Yes- chest congestion- white mucus 6. FEVER: "Do you have a fever?" If Yes, ask: "What is your temperature, how was it measured, and when did it start?"     No fever 7. RESPIRATORY STATUS: "Describe your breathing?" (e.g., shortness of breath, wheezing, unable to speak)      normal 8. BETTER-SAME-WORSE: "Are you getting better, staying the same or getting worse compared to yesterday?"  If getting worse, ask, "In what way?"     Worse- increase symptoms 9. HIGH RISK DISEASE: "Do you have any chronic medical problems?" (e.g., asthma, heart or lung disease, weak immune system, obesity,  etc.)     Age, high BP, over weight 10. VACCINE: "Have you had the COVID-19 vaccine?" If Yes, ask: "Which one, how many shots, when did you get it?"       Yes- pfizer  11. BOOSTER: "Have you received your COVID-19 booster?" If Yes, ask: "Which one and when did you get it?"       Yes-06/2020 12. PREGNANCY: "Is there any chance you are pregnant?" "When was your last menstrual period?"       N/a 13. OTHER SYMPTOMS: "Do you have any other symptoms?"  (e.g., chills, fatigue, headache, loss of smell or taste, muscle pain, sore throat)       Muscle pain, sinus pressure 14. O2 SATURATION MONITOR:  "Do you use an oxygen saturation monitor (pulse oximeter) at home?" If Yes, ask "What is your reading (oxygen level) today?" "What is your usual oxygen saturation reading?" (e.g., 95%)       97%  Protocols used: Coronavirus (COVID-19) Diagnosed or Suspected-A-AH

## 2021-03-14 ENCOUNTER — Other Ambulatory Visit: Payer: Self-pay | Admitting: Family Medicine

## 2021-03-14 DIAGNOSIS — I10 Essential (primary) hypertension: Secondary | ICD-10-CM

## 2021-03-15 ENCOUNTER — Other Ambulatory Visit: Payer: Self-pay | Admitting: Family Medicine

## 2021-03-15 DIAGNOSIS — I10 Essential (primary) hypertension: Secondary | ICD-10-CM

## 2021-05-10 ENCOUNTER — Encounter: Payer: Self-pay | Admitting: Family Medicine

## 2021-05-11 ENCOUNTER — Encounter: Payer: Self-pay | Admitting: Family Medicine

## 2021-05-16 ENCOUNTER — Other Ambulatory Visit: Payer: Self-pay | Admitting: Family Medicine

## 2021-05-16 DIAGNOSIS — Z1231 Encounter for screening mammogram for malignant neoplasm of breast: Secondary | ICD-10-CM

## 2021-06-08 ENCOUNTER — Other Ambulatory Visit: Payer: Self-pay | Admitting: Family Medicine

## 2021-06-08 DIAGNOSIS — I10 Essential (primary) hypertension: Secondary | ICD-10-CM

## 2021-06-08 NOTE — Telephone Encounter (Signed)
Pt needs appt only 30 day supply of bp meds given

## 2021-06-08 NOTE — Telephone Encounter (Signed)
Appt scheduled with Kathrine Haddock for 1.17.2023. pt informed script has been called in

## 2021-06-12 ENCOUNTER — Other Ambulatory Visit: Payer: Self-pay | Admitting: Family Medicine

## 2021-06-12 DIAGNOSIS — I10 Essential (primary) hypertension: Secondary | ICD-10-CM

## 2021-06-13 ENCOUNTER — Other Ambulatory Visit: Payer: Self-pay

## 2021-06-27 ENCOUNTER — Ambulatory Visit: Payer: Medicare HMO | Admitting: Unknown Physician Specialty

## 2021-07-03 ENCOUNTER — Ambulatory Visit
Admission: RE | Admit: 2021-07-03 | Discharge: 2021-07-03 | Disposition: A | Discharge status: Home / Self Care | Payer: Medicare HMO | Source: Ambulatory Visit | Attending: Family Medicine | Admitting: Family Medicine

## 2021-07-03 ENCOUNTER — Other Ambulatory Visit: Payer: Self-pay

## 2021-07-03 ENCOUNTER — Telehealth: Payer: Self-pay | Admitting: Family Medicine

## 2021-07-03 DIAGNOSIS — I10 Essential (primary) hypertension: Secondary | ICD-10-CM

## 2021-07-03 DIAGNOSIS — Z1231 Encounter for screening mammogram for malignant neoplasm of breast: Secondary | ICD-10-CM | POA: Diagnosis present

## 2021-07-03 NOTE — Telephone Encounter (Signed)
Lvm for pt to call and schedule an appt  

## 2021-07-03 NOTE — Telephone Encounter (Signed)
Only 30 days given pt needs an appt

## 2021-07-12 ENCOUNTER — Encounter: Payer: Self-pay | Admitting: Nurse Practitioner

## 2021-07-12 ENCOUNTER — Telehealth (INDEPENDENT_AMBULATORY_CARE_PROVIDER_SITE_OTHER): Payer: Medicare HMO | Admitting: Nurse Practitioner

## 2021-07-12 ENCOUNTER — Other Ambulatory Visit: Payer: Self-pay

## 2021-07-12 DIAGNOSIS — J069 Acute upper respiratory infection, unspecified: Secondary | ICD-10-CM | POA: Diagnosis not present

## 2021-07-12 MED ORDER — BENZONATATE 100 MG PO CAPS: 200.0000 mg | ORAL_CAPSULE | Freq: Two times a day (BID) | ORAL | 0 refills | Status: DC | PRN

## 2021-07-12 MED ORDER — FLUTICASONE PROPIONATE 50 MCG/ACT NA SUSP: 2.0000 | Freq: Every day | NASAL | 6 refills | Status: DC

## 2021-07-12 NOTE — Progress Notes (Signed)
Name: Pamela Dixon   MRN: [id number]    DOB: [birth year]   Date:07/12/2021       Progress Note  Subjective  Chief Complaint  Chief Complaint  Patient presents with   URI    Cough, congested, runny nose,     I connected with  Pamela Dixon  on 07/12/21 at 1:20 pm by a video enabled telemedicine application and verified that I am speaking with the correct person using two identifiers.  I discussed the limitations of evaluation and management by telemedicine and the availability of in person appointments. The patient expressed understanding and agreed to proceed with a virtual visit  Staff also discussed with the patient that there may be a patient responsible charge related to this service. Patient Location: home Provider Location: [facility] Additional Individuals present: alone  HPI  URI: She says symptoms started Sunday night.  She says she has a cough and nasal congestion. She denies any fever or shortness of breath. She says she has been taking Coricidin.  She says she has done two at home covid tests.  Discussed getting flu and covid PCR test done and she declined.  Discussed OTC treatments for symptoms like mucinex, flonase and zyrtec.  She says she has zyrtec at home.  Sent in prescription for flonase and tessalon perls.  Discussed pushing fluids and getting rest.    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   Vitamin D deficiency 08/19/2019   Mixed hyperlipidemia 04/21/2019   Essential hypertension, benign 06/10/2018   Class 3 severe obesity with serious comorbidity and body mass index (BMI) of 40.0 to 44.9 in adult [facility]) 06/10/2018    Social History   Tobacco Use   Smoking status: Never   Smokeless tobacco: Never  Substance Use Topics   Alcohol use: No    Alcohol/week: 0.0 standard drinks     Current Outpatient Medications:    aspirin (BAYER ASPIRIN EC LOW DOSE) 81 MG EC tablet, Take 1 tablet (81 mg total) by mouth  daily., Disp: , Rfl:    atorvastatin (LIPITOR) 20 MG tablet, Take every other day 4X a week, Disp: 48 tablet, Rfl: 3   cholecalciferol (VITAMIN D3) 25 MCG (1000 UNIT) tablet, 1 each daily., Disp: , Rfl:    Coenzyme Q10-Vitamin E (QUNOL ULTRA COQ10 PO), , Disp: , Rfl:    losartan-hydrochlorothiazide (HYZAAR) 100-25 MG tablet, TAKE 1 TABLET BY MOUTH EVERY DAY, Disp: 30 tablet, Rfl: 0   metoprolol succinate (TOPROL-XL) 25 MG 24 hr tablet, TAKE 1 TABLET BY MOUTH EVERY DAY, Disp: 90 tablet, Rfl: 0  No Known Allergies  I personally reviewed active problem list, medication list, allergies, notes from last encounter with the patient/caregiver today.  ROS  Constitutional: Negative for fever or weight change.  HEENT: positive for nasal congestion Respiratory: Positive for cough and shortness of breath.   Cardiovascular: Negative for chest pain or palpitations.  Gastrointestinal: Negative for abdominal pain, no bowel changes.  Musculoskeletal: Negative for gait problem or joint swelling.  Skin: Negative for rash.  Neurological: Negative for dizziness or headache.  No other specific complaints in a complete review of systems (except as listed in HPI above).   Objective  Virtual encounter, vitals not obtained.  There is no height or weight on file to calculate BMI.  Nursing Note and Vital Signs reviewed.  Physical Exam  Awake, alert, and oriented speaking in complete sentences  No results found for this  or any previous visit (from the past 72 hour(s)).  Assessment & Plan  1. Viral upper respiratory tract infection -push fluids and get rest -mucinex, and zyrtec - fluticasone (FLONASE) 50 MCG/ACT nasal spray; Place 2 sprays into both nostrils daily.  Dispense: 16 g; Refill: 6 - benzonatate (TESSALON) 100 MG capsule; Take 2 capsules (200 mg total) by mouth 2 (two) times daily as needed for cough.  Dispense: 20 capsule; Refill: 0   -Red flags and when to present for emergency care or RTC  including fever >101.64F, chest pain, shortness of breath, new/worsening/un-resolving symptoms,  reviewed with patient at time of visit. Follow up and care instructions discussed and provided in AVS. - I discussed the assessment and treatment plan with the patient. The patient was provided an opportunity to ask questions and all were answered. The patient agreed with the plan and demonstrated an understanding of the instructions.  I provided 15 minutes of non-face-to-face time during this encounter.  [name], FNP

## 2021-07-29 ENCOUNTER — Telehealth: Payer: Self-pay | Admitting: Family Medicine

## 2021-07-29 DIAGNOSIS — I10 Essential (primary) hypertension: Secondary | ICD-10-CM

## 2021-07-31 NOTE — Telephone Encounter (Signed)
Lvm for pt to call and schedule an appt  

## 2021-07-31 NOTE — Telephone Encounter (Signed)
30 days given, no more refills after this she needs an appt

## 2021-08-17 ENCOUNTER — Telehealth: Payer: Self-pay

## 2021-08-17 DIAGNOSIS — I10 Essential (primary) hypertension: Secondary | ICD-10-CM

## 2021-08-17 DIAGNOSIS — E782 Mixed hyperlipidemia: Secondary | ICD-10-CM

## 2021-08-17 NOTE — Telephone Encounter (Signed)
Copied from [place] [id number]. Topic: General - Other ?>> Aug 17, 2021 11:01 AM McGill, Nelva Bush wrote: ?Reason for CRM: Pt stated she would like lab work done for her upcoming appointment 08/21/2021. ? ?Please advice. ?

## 2021-08-17 NOTE — Telephone Encounter (Signed)
Pt has been informed.

## 2021-08-21 ENCOUNTER — Encounter: Payer: Self-pay | Admitting: Physician Assistant

## 2021-08-21 ENCOUNTER — Ambulatory Visit (INDEPENDENT_AMBULATORY_CARE_PROVIDER_SITE_OTHER): Payer: Medicare HMO | Admitting: Physician Assistant

## 2021-08-21 VITALS — BP 138/72 | HR 100 | Temp 97.9°F | Resp 16 | Ht 66.0 in | Wt 252.6 lb

## 2021-08-21 DIAGNOSIS — E782 Mixed hyperlipidemia: Secondary | ICD-10-CM | POA: Diagnosis not present

## 2021-08-21 DIAGNOSIS — E559 Vitamin D deficiency, unspecified: Secondary | ICD-10-CM

## 2021-08-21 DIAGNOSIS — Z6841 Body Mass Index (BMI) 40.0 and over, adult: Secondary | ICD-10-CM

## 2021-08-21 DIAGNOSIS — I10 Essential (primary) hypertension: Secondary | ICD-10-CM | POA: Diagnosis not present

## 2021-08-21 DIAGNOSIS — E66813 Obesity, class 3: Secondary | ICD-10-CM

## 2021-08-21 MED ORDER — METOPROLOL SUCCINATE ER 25 MG PO TB24: 25.0000 mg | ORAL_TABLET | Freq: Every day | ORAL | 3 refills | Status: DC

## 2021-08-21 MED ORDER — ATORVASTATIN CALCIUM 20 MG PO TABS: ORAL_TABLET | ORAL | 0 refills | Status: DC

## 2021-08-21 MED ORDER — LOSARTAN POTASSIUM-HCTZ 100-25 MG PO TABS: 1.0000 | ORAL_TABLET | Freq: Every day | ORAL | 3 refills | Status: DC

## 2021-08-21 NOTE — Assessment & Plan Note (Signed)
Chronic, historic condition ?Appears well managed with atorvastatin  ?Recheck lipid panel today ?Results to dictate further management ?Discussed dietary changes and physical activity regimen to assist with further management.  ? ?

## 2021-08-21 NOTE — Patient Instructions (Signed)
Please keep a daily log of your blood pressures at home and return to [place] with those results in about 4-6 weeks so we can review what your blood pressure looks like outside of the office ? ?I recommend increasing your physical activity to at least 150 minutes of moderate intensity physical activity per week to assist with blood pressure improvement.  ? ?I have sent in your medications as requested ?We will keep you informed of your lab results as they become available ? ?It was nice to meet you and I appreciate the opportunity to be involved in your care ? ?

## 2021-08-21 NOTE — Assessment & Plan Note (Signed)
Chronic, historic condition ?Discussed increasing physical activity and effects this can have on heart health as well as overall physical health benefits ?Recommend diet low in sodium and saturated fats to assist with cholesterol and heart health.  ?Will check A1c today.  ? ?

## 2021-08-21 NOTE — Assessment & Plan Note (Signed)
Chronic, historic condition ?Currently managed with metoprolol and Losartan- HCTZ  ?Patient reports BP readings 140s/80-90s at home which makes me concerned for continued elevated BP ?Discussed keeping BP records for the next few weeks and returning to next apt with those to assess for potentially undermanaged HTN  ?May need to increase or add another agent for adequate control based on results ?Discussed lifestyle modifications to assist with BP management to include increased physical activity and reduced sodium, reduced saturated fat diet.  ?Follow up recommended in 4-6 weeks ?

## 2021-08-21 NOTE — Progress Notes (Signed)
? ? ? ?Name: Pamela Dixon   MRN: [id number]    DOB: 1955-06-26   Date:08/21/2021 ? ?     Progress Note ? ?Today's Provider: Talitha Givens, MHS, PA-C ?Introduced myself to the patient as a Journalist, newspaper and provided education on APPs in clinical practice.  ? ? ?Subjective ? ?Chief Complaint:  ? ?Chief Complaint  ?Patient presents with  ? Follow-up  ?  Medication refill  ? ? ?HPI ?Reports she is taking her BP readings at home ?States it is usually in the 140s/ 80s  ?She is using a large sized cuff to take her BP at home and is using an arm cuff. ? ? ? ?Patient Active Problem List  ? Diagnosis Date Noted  ? 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 Vitamin D deficiency 08/19/2019  ? Mixed hyperlipidemia 04/21/2019  ? Essential hypertension, benign 06/10/2018  ? Class 3 severe obesity with serious comorbidity and body mass index (BMI) of 40.0 to 44.9 in adult [facility]) 06/10/2018  ? ? ?Past Surgical History:  ?Procedure Laterality Date  ? ABDOMINAL HYSTERECTOMY  Early 90's  ? COLONOSCOPY WITH PROPOFOL N/A 03/15/2017  ? Procedure: COLONOSCOPY WITH PROPOFOL;  Surgeon: Manya Silvas, MD;  Location[facility] ENDOSCOPY;  Service: Endoscopy;  Laterality: N/A;  ? TUBAL LIGATION  1985  ? ? ?Family History  ?Problem Relation Age of Onset  ? Diabetes Father   ? Cancer Father   ?     colon skin cancer to head  ? Breast cancer Neg Hx   ? ? ?Social History  ? ?Tobacco Use  ? Smoking status: Never  ? Smokeless tobacco: Never  ?Substance Use Topics  ? Alcohol use: No  ?  Alcohol/week: 0.0 standard drinks  ? ? ? ?Current Outpatient Medications:  ?  aspirin (BAYER ASPIRIN EC LOW DOSE) 81 MG EC tablet, Take 1 tablet (81 mg total) by mouth daily., Disp: , Rfl:  ?  cholecalciferol (VITAMIN D3) 25 MCG (1000 UNIT) tablet, 1 each daily., Disp: , Rfl:  ?  Coenzyme Q10-Vitamin E (QUNOL ULTRA COQ10 PO), , Disp: , Rfl:  ?  atorvastatin (LIPITOR) 20 MG tablet, Take every other day 4X a week, Disp: 48 tablet, Rfl: 0 ?  losartan-hydrochlorothiazide (HYZAAR)  100-25 MG tablet, Take 1 tablet by mouth daily., Disp: 30 tablet, Rfl: 3 ?  metoprolol succinate (TOPROL-XL) 25 MG 24 hr tablet, Take 1 tablet (25 mg total) by mouth daily., Disp: 30 tablet, Rfl: 3 ? ?No Known Allergies ? ? ? ? ?Review of Systems  ?Constitutional:  Negative for diaphoresis and malaise/fatigue.  ?Respiratory:  Negative for cough, shortness of breath and wheezing.   ?Cardiovascular:  Negative for chest pain, palpitations, claudication and leg swelling.  ?Neurological:  Negative for dizziness, weakness and headaches.  ? ? ? ?Objective ? ?Vitals:  ? 08/21/21 0859  ?BP: 138/72  ?Pulse: 100  ?Resp: 16  ?Temp: 97.9 ?F (36.6 ?C)  ?TempSrc: Oral  ?SpO2: 96%  ?Weight: 252 lb 9.6 oz (114.6 kg)  ?Height: '5\' 6"'$  (1.676 m)  ? ? ?Body mass index is 40.77 kg/m?. ? ?Physical Exam ?Vitals reviewed.  ?Constitutional:   ?   General: She is awake.  ?   Appearance: Normal appearance. She is well-developed and well-groomed. She is obese.  ?HENT:  ?   Head: Normocephalic and atraumatic.  ?Eyes:  ?   General: Lids are normal.  ?   Extraocular Movements: Extraocular movements intact.  ?   Conjunctiva/sclera: Conjunctivae normal.  ?Cardiovascular:  ?  Rate and Rhythm: Normal rate and regular rhythm.  ?   Pulses: Normal pulses.     ?     Carotid pulses are 2+ on the right side and 2+ on the left side. ?     Radial pulses are 2+ on the right side and 2+ on the left side.  ?   Heart sounds: Normal heart sounds.  ?Pulmonary:  ?   Effort: Pulmonary effort is normal.  ?   Breath sounds: Normal breath sounds. No decreased breath sounds, wheezing, rhonchi or rales.  ?Musculoskeletal:  ?   Right lower leg: No edema.  ?   Left lower leg: No edema.  ?Skin: ?   General: Skin is warm and moist.  ?Neurological:  ?   Mental Status: She is alert.  ?Psychiatric:     ?   Attention and Perception: Attention and perception normal.     ?   Mood and Affect: Mood normal.     ?   Speech: Speech normal.     ?   Behavior: Behavior normal. Behavior is  cooperative.  ? ? ? ? ?No results found for this or any previous visit (from the past 2160 hour(s)). ? ? ? ?PHQ2/9: ?Depression screen [facility] 2/9 08/21/2021 07/12/2021 09/20/2020 08/29/2020 11/16/2019  ?Decreased Interest 0 0 0 0 0  ?Down, Depressed, Hopeless 0 0 0 0 0  ?PHQ - 2 Score 0 0 0 0 0  ?Altered sleeping - - - - 0  ?Tired, decreased energy - - - - 0  ?Change in appetite - - - - 0  ?Feeling bad or failure about yourself  - - - - 0  ?Trouble concentrating - - - - 0  ?Moving slowly or fidgety/restless - - - - 0  ?Suicidal thoughts - - - - 0  ?PHQ-9 Score - - - - 0  ?Difficult doing work/chores - - - - Not difficult at all  ?  ?phq 9 is negative ? ? ?Fall Risk: ?Fall Risk  08/21/2021 07/12/2021 09/20/2020 08/29/2020 11/16/2019  ?Falls in the past year? 0 0 0 0 0  ?Number falls in past yr: - 0 0 0 0  ?Injury with Fall? - 0 0 0 0  ?Risk for fall due to : - - No Fall Risks - -  ?Follow up Falls prevention discussed Falls evaluation completed Falls prevention discussed Falls evaluation completed -  ? ? ? ? ?Functional Status Survey: ?Is the patient deaf or have difficulty hearing?: No ?Does the patient have difficulty seeing, even when wearing glasses/contacts?: No ?Does the patient have difficulty concentrating, remembering, or making decisions?: No ?Does the patient have difficulty walking or climbing stairs?: No ?Does the patient have difficulty dressing or bathing?: No ?Does the patient have difficulty doing errands alone such as visiting a doctor's office or shopping?: No ? ? ?Assessment & Plan ? ?Problem List Items Addressed This Visit   ? ?  ? Cardiovascular and Mediastinum  ? Essential hypertension, benign - Primary  ?  Chronic, historic condition ?Currently managed with metoprolol and Losartan- HCTZ  ?Patient reports BP readings 140s/80-90s at home which makes me concerned for continued elevated BP ?Discussed keeping BP records for the next few weeks and returning to next apt with those to assess for potentially  undermanaged HTN  ?May need to increase or add another agent for adequate control based on results ?Discussed lifestyle modifications to assist with BP management to include increased physical activity and reduced sodium, reduced saturated  fat diet.  ?Follow up recommended in 4-6 weeks ?  ?  ? Relevant Medications  ? atorvastatin (LIPITOR) 20 MG tablet  ? losartan-hydrochlorothiazide (HYZAAR) 100-25 MG tablet  ? metoprolol succinate (TOPROL-XL) 25 MG 24 hr tablet  ? Other Relevant Orders  ? CBC w/Diff/Platelet  ?  ? Other  ? Class 3 severe obesity with serious comorbidity and body mass index (BMI) of 40.0 to 44.9 in adult [facility])  ?  Chronic, historic condition ?Discussed increasing physical activity and effects this can have on heart health as well as overall physical health benefits ?Recommend diet low in sodium and saturated fats to assist with cholesterol and heart health.  ?Will check A1c today.  ? ?  ?  ? Relevant Orders  ? Comprehensive Metabolic Panel (CMET)  ? Lipid Profile  ? HgB A1c  ? Mixed hyperlipidemia  ?  Chronic, historic condition ?Appears well managed with atorvastatin  ?Recheck lipid panel today ?Results to dictate further management ?Discussed dietary changes and physical activity regimen to assist with further management.  ? ?  ?  ? Relevant Medications  ? atorvastatin (LIPITOR) 20 MG tablet  ? losartan-hydrochlorothiazide (HYZAAR) 100-25 MG tablet  ? metoprolol succinate (TOPROL-XL) 25 MG 24 hr tablet  ? Other Relevant Orders  ? Lipid Profile  ? Vitamin D deficiency  ? Relevant Orders  ? Vitamin D (25 hydroxy)  ? ? ? ?Return in about 6 weeks (around 10/02/2021) for HTN. ? ? ?I, Laylanie Kruczek E Monicka Cyran, PA-C, have reviewed all documentation for this visit. The documentation on 08/21/21 for the exam, diagnosis, procedures, and orders are all accurate and complete. ? ? ?Mashelle Busick, Glennie [place] MPH ?[place] ?[facility] ? ?

## 2021-08-22 LAB — CBC WITH DIFFERENTIAL/PLATELET
Absolute Monocytes: 650 cells/uL (ref 200–950)
Basophils Absolute: 73 cells/uL (ref 0–200)
Basophils Relative: 1 %
Eosinophils Absolute: 409 cells/uL (ref 15–500)
Eosinophils Relative: 5.6 %
HCT: 44.4 % (ref 35.0–45.0)
Hemoglobin: 15.3 g/dL (ref 11.7–15.5)
Lymphs Abs: 3066 cells/uL (ref 850–3900)
MCH: 31.5 pg (ref 27.0–33.0)
MCHC: 34.5 g/dL (ref 32.0–36.0)
MCV: 91.4 fL (ref 80.0–100.0)
MPV: 9.5 fL (ref 7.5–12.5)
Monocytes Relative: 8.9 %
Neutro Abs: 3103 cells/uL (ref 1500–7800)
Neutrophils Relative %: 42.5 %
Platelets: 276 10*3/uL (ref 140–400)
RBC: 4.86 10*6/uL (ref 3.80–5.10)
RDW: 11.9 % (ref 11.0–15.0)
Total Lymphocyte: 42 %
WBC: 7.3 10*3/uL (ref 3.8–10.8)

## 2021-08-22 LAB — COMPREHENSIVE METABOLIC PANEL
AG Ratio: 1.7 (calc) (ref 1.0–2.5)
ALT: 13 U/L (ref 6–29)
AST: 13 U/L (ref 10–35)
Albumin: 4.3 g/dL (ref 3.6–5.1)
Alkaline phosphatase (APISO): 44 U/L (ref 37–153)
BUN: 16 mg/dL (ref 7–25)
CO2: 29 mmol/L (ref 20–32)
Calcium: 9.7 mg/dL (ref 8.6–10.4)
Chloride: 104 mmol/L (ref 98–110)
Creat: 0.74 mg/dL (ref 0.50–1.05)
Globulin: 2.6 g/dL (calc) (ref 1.9–3.7)
Glucose, Bld: 114 mg/dL — ABNORMAL HIGH (ref 65–99)
Potassium: 4.3 mmol/L (ref 3.5–5.3)
Sodium: 141 mmol/L (ref 135–146)
Total Bilirubin: 0.7 mg/dL (ref 0.2–1.2)
Total Protein: 6.9 g/dL (ref 6.1–8.1)

## 2021-08-22 LAB — HEMOGLOBIN A1C
Hgb A1c MFr Bld: 5.5 % of total Hgb (ref <5.7)
Mean Plasma Glucose: 111 mg/dL
eAG (mmol/L): 6.2 mmol/L

## 2021-08-22 LAB — LIPID PANEL
Cholesterol: 152 mg/dL (ref <200)
HDL: 49 mg/dL — ABNORMAL LOW (ref >=50)
LDL Cholesterol (Calc): 78 mg/dL (calc)
Non-HDL Cholesterol (Calc): 103 mg/dL (calc) (ref <130)
Total CHOL/HDL Ratio: 3.1 (calc) (ref <5.0)
Triglycerides: 152 mg/dL — ABNORMAL HIGH (ref <150)

## 2021-08-22 LAB — VITAMIN D 25 HYDROXY (VIT D DEFICIENCY, FRACTURES): Vit D, 25-Hydroxy: 31 ng/mL (ref 30–100)

## 2021-08-23 ENCOUNTER — Other Ambulatory Visit: Payer: Self-pay | Admitting: Family Medicine

## 2021-08-23 DIAGNOSIS — I10 Essential (primary) hypertension: Secondary | ICD-10-CM

## 2021-09-15 ENCOUNTER — Other Ambulatory Visit: Payer: Self-pay | Admitting: Family Medicine

## 2021-09-15 DIAGNOSIS — I10 Essential (primary) hypertension: Secondary | ICD-10-CM

## 2021-09-20 ENCOUNTER — Other Ambulatory Visit: Payer: Self-pay | Admitting: Family Medicine

## 2021-09-20 DIAGNOSIS — E782 Mixed hyperlipidemia: Secondary | ICD-10-CM

## 2021-09-20 MED ORDER — ATORVASTATIN CALCIUM 20 MG PO TABS: ORAL_TABLET | ORAL | 0 refills | Status: DC

## 2021-09-21 ENCOUNTER — Ambulatory Visit: Payer: Medicare HMO

## 2021-09-28 ENCOUNTER — Ambulatory Visit (INDEPENDENT_AMBULATORY_CARE_PROVIDER_SITE_OTHER): Payer: Medicare HMO

## 2021-09-28 DIAGNOSIS — Z0000 Encounter for general adult medical examination without abnormal findings: Secondary | ICD-10-CM

## 2021-09-28 DIAGNOSIS — Z780 Asymptomatic menopausal state: Secondary | ICD-10-CM | POA: Diagnosis not present

## 2021-09-28 NOTE — Patient Instructions (Signed)
Ms. Gaster , ?Thank you for taking time to come for your Medicare Wellness Visit. I appreciate your ongoing commitment to your health goals. Please review the following plan we discussed and let me know if I can assist you in the future.  ? ?Screening recommendations/referrals: ?Colonoscopy: done 03/02/21. Repeat 02/2024 ?Mammogram: done 07/03/21 ?Bone Density: dibe 09/02/19. Please call [phone] to schedule your bone density screening.  ?Recommended yearly ophthalmology/optometry visit for glaucoma screening and checkup ?Recommended yearly dental visit for hygiene and checkup ? ?Vaccinations: ?Influenza vaccine: done 04/14/21 ?Pneumococcal vaccine: done 08/29/20. Due for Prevnar20 ?Tdap vaccine: done 04/15/17 ?Shingles vaccine: done 12/05/17 & 03/19/19   ?Covid-19:done 08/12/19, 09/02/19, 06/29/20 & 04/14/21 ? ?Advanced directives: Please bring a copy of your health care power of attorney and living will to the office at your convenience once you have completed that paperwork.  ? ?Conditions/risks identified: Keep up the great work! ? ?Next appointment: Follow up in one year for your annual wellness visit  ? ? ?Preventive Care 18 Years and Older, Female ?Preventive care refers to lifestyle choices and visits with your health care provider that can promote health and wellness. ?What does preventive care include? ?A yearly physical exam. This is also called an annual well check. ?Dental exams once or twice a year. ?Routine eye exams. Ask your health care provider how often you should have your eyes checked. ?Personal lifestyle choices, including: ?Daily care of your teeth and gums. ?Regular physical activity. ?Eating a healthy diet. ?Avoiding tobacco and drug use. ?Limiting alcohol use. ?Practicing safe sex. ?Taking low-dose aspirin every day. ?Taking vitamin and mineral supplements as recommended by your health care provider. ?What happens during an annual well check? ?The services and screenings done by your health care  provider during your annual well check will depend on your age, overall health, lifestyle risk factors, and family history of disease. ?Counseling  ?Your health care provider may ask you questions about your: ?Alcohol use. ?Tobacco use. ?Drug use. ?Emotional well-being. ?Home and relationship well-being. ?Sexual activity. ?Eating habits. ?History of falls. ?Memory and ability to understand (cognition). ?Work and work Statistician. ?Reproductive health. ?Screening  ?You may have the following tests or measurements: ?Height, weight, and BMI. ?Blood pressure. ?Lipid and cholesterol levels. These may be checked every 5 years, or more frequently if you are over 52 years old. ?Skin check. ?Lung cancer screening. You may have this screening every year starting at age 3 if you have a 30-pack-year history of smoking and currently smoke or have quit within the past 15 years. ?Fecal occult blood test (FOBT) of the stool. You may have this test every year starting at age 57. ?Flexible sigmoidoscopy or colonoscopy. You may have a sigmoidoscopy every 5 years or a colonoscopy every 10 years starting at age 12. ?Hepatitis C blood test. ?Hepatitis B blood test. ?Sexually transmitted disease (STD) testing. ?Diabetes screening. This is done by checking your blood sugar (glucose) after you have not eaten for a while (fasting). You may have this done every 1-3 years. ?Bone density scan. This is done to screen for osteoporosis. You may have this done starting at age 27. ?Mammogram. This may be done every 1-2 years. Talk to your health care provider about how often you should have regular mammograms. ?Talk with your health care provider about your test results, treatment options, and if necessary, the need for more tests. ?Vaccines  ?Your health care provider may recommend certain vaccines, such as: ?Influenza vaccine. This is recommended every year. ?Tetanus, diphtheria,  and acellular pertussis (Tdap, Td) vaccine. You may need a Td  booster every 10 years. ?Zoster vaccine. You may need this after age 66. ?Pneumococcal 13-valent conjugate (PCV13) vaccine. One dose is recommended after age 54. ?Pneumococcal polysaccharide (PPSV23) vaccine. One dose is recommended after age 63. ?Talk to your health care provider about which screenings and vaccines you need and how often you need them. ?This information is not intended to replace advice given to you by your health care provider. Make sure you discuss any questions you have with your health care provider. ?Document Released: 06/24/2015 Document Revised: 02/15/2016 Document Reviewed: 03/29/2015 ?Elsevier Interactive Patient Education ? 2017 [place]. ? ?Fall Prevention in the Home ?Falls can cause injuries. They can happen to people of all ages. There are many things you can do to make your home safe and to help prevent falls. ?What can I do on the outside of my home? ?Regularly fix the edges of walkways and driveways and fix any cracks. ?Remove anything that might make you trip as you walk through a door, such as a raised step or threshold. ?Trim any bushes or trees on the path to your home. ?Use bright outdoor lighting. ?Clear any walking paths of anything that might make someone trip, such as rocks or tools. ?Regularly check to see if handrails are loose or broken. Make sure that both sides of any steps have handrails. ?Any raised decks and porches should have guardrails on the edges. ?Have any leaves, snow, or ice cleared regularly. ?Use sand or salt on walking paths during winter. ?Clean up any spills in your garage right away. This includes oil or grease spills. ?What can I do in the bathroom? ?Use night lights. ?Install grab bars by the toilet and in the tub and shower. Do not use towel bars as grab bars. ?Use non-skid mats or decals in the tub or shower. ?If you need to sit down in the shower, use a plastic, non-slip stool. ?Keep the floor dry. Clean up any water that spills on the floor  as soon as it happens. ?Remove soap buildup in the tub or shower regularly. ?Attach bath mats securely with double-sided non-slip rug tape. ?Do not have throw rugs and other things on the floor that can make you trip. ?What can I do in the bedroom? ?Use night lights. ?Make sure that you have a light by your bed that is easy to reach. ?Do not use any sheets or blankets that are too big for your bed. They should not hang down onto the floor. ?Have a firm chair that has side arms. You can use this for support while you get dressed. ?Do not have throw rugs and other things on the floor that can make you trip. ?What can I do in the kitchen? ?Clean up any spills right away. ?Avoid walking on wet floors. ?Keep items that you use a lot in easy-to-reach places. ?If you need to reach something above you, use a strong step stool that has a grab bar. ?Keep electrical cords out of the way. ?Do not use floor polish or wax that makes floors slippery. If you must use wax, use non-skid floor wax. ?Do not have throw rugs and other things on the floor that can make you trip. ?What can I do with my stairs? ?Do not leave any items on the stairs. ?Make sure that there are handrails on both sides of the stairs and use them. Fix handrails that are broken or loose. Make sure  that handrails are as long as the stairways. ?Check any carpeting to make sure that it is firmly attached to the stairs. Fix any carpet that is loose or worn. ?Avoid having throw rugs at the top or bottom of the stairs. If you do have throw rugs, attach them to the floor with carpet tape. ?Make sure that you have a light switch at the top of the stairs and the bottom of the stairs. If you do not have them, ask someone to add them for you. ?What else can I do to help prevent falls? ?Wear shoes that: ?Do not have high heels. ?Have rubber bottoms. ?Are comfortable and fit you well. ?Are closed at the toe. Do not wear sandals. ?If you use a stepladder: ?Make sure that it is  fully opened. Do not climb a closed stepladder. ?Make sure that both sides of the stepladder are locked into place. ?Ask someone to hold it for you, if possible. ?Clearly mark and make sure that you can see: ?A

## 2021-09-28 NOTE — Progress Notes (Signed)
? ?Subjective:  ? Pamela Dixon is a 66 y.o. female who presents for Medicare Annual (Subsequent) preventive examination. ? ?Virtual Visit via Telephone Note ? ?I connected with  [name] on 09/28/21 at 11:15 AM EDT by telephone and verified that I am speaking with the correct person using two identifiers. ? ?Location: ?Patient: home ?Provider: [facility] ?Persons participating in the virtual visit: patient/Nurse Health Advisor ?  ?I discussed the limitations, risks, security and privacy concerns of performing an evaluation and management service by telephone and the availability of in person appointments. The patient expressed understanding and agreed to proceed. ? ?Interactive audio and video telecommunications were attempted between this nurse and patient, however failed, due to patient having technical difficulties OR patient did not have access to video capability.  We continued and completed visit with audio only. ? ?Some vital signs may be absent or patient reported.  ? ?Clemetine Marker, LPN ? ? ?Review of Systems    ? ?  ? ?   ?Objective:  ?  ?Today's Vitals  ? 09/28/21 1122  ?PainSc: 0-No pain  ? ?There is no height or weight on file to calculate BMI. ? ? ?  09/28/2021  ? 11:25 AM 09/20/2020  ?  3:16 PM 08/19/2019  ? 10:59 PM 03/15/2017  ?  9:41 AM  ?Advanced Directives  ?Does Patient Have a Medical Advance Directive? No No No No  ?Does patient want to make changes to medical advance directive? No - Patient declined     ?Would patient like information on creating a medical advance directive?  Yes (MAU/Ambulatory/Procedural Areas - Information given) Yes (MAU/Ambulatory/Procedural Areas - Information given)   ? ? ?Current Medications (verified) ?Outpatient Encounter Medications as of 09/28/2021  ?Medication Sig  ? aspirin (BAYER ASPIRIN EC LOW DOSE) 81 MG EC tablet Take 1 tablet (81 mg total) by mouth daily.  ? atorvastatin (LIPITOR) 20 MG tablet Take every other day 4X a week  ? cholecalciferol (VITAMIN D3) 25 MCG  (1000 UNIT) tablet 1 each daily.  ? Coenzyme Q10-Vitamin E (QUNOL ULTRA COQ10 PO)   ? losartan-hydrochlorothiazide (HYZAAR) 100-25 MG tablet Take 1 tablet by mouth daily.  ? metoprolol succinate (TOPROL-XL) 25 MG 24 hr tablet Take 1 tablet (25 mg total) by mouth daily.  ? ?No facility-administered encounter medications on file as of 09/28/2021.  ? ? ?Allergies (verified) ?Patient has no known allergies.  ? ?History: ?Past Medical History:  ?Diagnosis Date  ? High cholesterol   ? Hypertension   ? Mixed hyperlipidemia   ? ?Past Surgical History:  ?Procedure Laterality Date  ? ABDOMINAL HYSTERECTOMY  Early 90's  ? COLONOSCOPY WITH PROPOFOL N/A 03/15/2017  ? Procedure: COLONOSCOPY WITH PROPOFOL;  Surgeon: Manya Silvas, MD;  Location: [facility] ENDOSCOPY;  Service: Endoscopy;  Laterality: N/A;  ? TUBAL LIGATION  1985  ? ?Family History  ?Problem Relation Age of Onset  ? Diabetes Father   ? Cancer Father   ?     colon skin cancer to head  ? Breast cancer Neg Hx   ? ?Social History  ? ?Socioeconomic History  ? Marital status: Married  ?  Spouse name: Broadus John  ? Number of children: 2  ? Years of education: 64  ? Highest education level: High school graduate  ?Occupational History  ? Not on file  ?Tobacco Use  ? Smoking status: Never  ? Smokeless tobacco: Never  ?Vaping Use  ? Vaping Use: Never used  ?Substance and Sexual Activity  ?  Alcohol use: No  ? Drug use: No  ? Sexual activity: Yes  ?Other Topics Concern  ? Not on file  ?Social History Narrative  ? Not on file  ? ?Social Determinants of Health  ? ?Financial Resource Strain: Low Risk   ? Difficulty of Paying Living Expenses: Not hard at all  ?Food Insecurity: No Food Insecurity  ? Worried About Charity fundraiser in the Last Year: Never true  ? Ran Out of Food in the Last Year: Never true  ?Transportation Needs: No Transportation Needs  ? Lack of Transportation (Medical): No  ? Lack of Transportation (Non-Medical): No  ?Physical Activity: Insufficiently Active  ? Days of  Exercise per Week: 4 days  ? Minutes of Exercise per Session: 30 min  ?Stress: No Stress Concern Present  ? Feeling of Stress : Not at all  ?Social Connections: Moderately Integrated  ? Frequency of Communication with Friends and Family: More than three times a week  ? Frequency of Social Gatherings with Friends and Family: Twice a week  ? Attends Religious Services: More than 4 times per year  ? Active Member of Clubs or Organizations: No  ? Attends Archivist Meetings: Never  ? Marital Status: Married  ? ? ?Tobacco Counseling ?Counseling given: Not Answered ? ? ?Clinical Intake: ? ?Pre-visit preparation completed: Yes ? ?Pain : No/denies pain ?Pain Score: 0-No pain ? ?  ? ?Nutritional Risks: None ?Diabetes: No ? ?How often do you need to have someone help you when you read instructions, pamphlets, or other written materials from your doctor or pharmacy?: 1 - Never ? ? ? ?Interpreter Needed?: No ? ?Information entered by :: Clemetine Marker LPN ? ? ?Activities of Daily Living ? ?  08/21/2021  ?  9:01 AM 07/12/2021  ? 11:42 AM  ?In your present state of health, do you have any difficulty performing the following activities:  ?Hearing? 0 0  ?Vision? 0 0  ?Difficulty concentrating or making decisions? 0 0  ?Walking or climbing stairs? 0 0  ?Dressing or bathing? 0 0  ?Doing errands, shopping? 0 0  ? ? ?Patient Care Team: ?Delsa Grana, PA-C as PCP - General (Family Medicine) ? ?Indicate any recent Medical Services you may have received from other than [facility] providers in the past year (date may be approximate). ? ?   ?Assessment:  ? This is a routine wellness examination for Pamela Dixon. ? ?Hearing/Vision screen ?Hearing Screening - Comments:: Pt denies hearing difficulty ?Vision Screening - Comments:: Annual vision screenings done at [place] ? ?Dietary issues and exercise activities discussed: ?  ? ? Goals Addressed   ? ?  ?  ?  ?  ? This Visit's Progress  ?  DIET - INCREASE WATER INTAKE   Not on track  ?  Recommend  drinking 6-8 glasses of water per day  ?  ? ?  ? ?Depression Screen ? ?  09/28/2021  ? 11:23 AM 08/21/2021  ?  9:01 AM 07/12/2021  ? 11:42 AM 09/20/2020  ?  3:15 PM 08/29/2020  ? 10:23 AM 11/16/2019  ?  8:21 AM 08/13/2019  ?  9:59 AM  ?PHQ 2/9 Scores  ?PHQ - 2 Score 0 0 0 0 0 0 0  ?PHQ- 9 Score      0 0  ?  ?Fall Risk ? ?  09/28/2021  ? 11:55 AM 08/21/2021  ?  9:01 AM 07/12/2021  ? 11:42 AM 09/20/2020  ?  3:17 PM 08/29/2020  ?  10:23 AM  ?Fall Risk   ?Falls in the past year? 0 0 0 0 0  ?Number falls in past yr: 0  0 0 0  ?Injury with Fall? 0  0 0 0  ?Risk for fall due to : No Fall Risks   No Fall Risks   ?Follow up Falls prevention discussed Falls prevention discussed Falls evaluation completed Falls prevention discussed Falls evaluation completed  ? ? ?FALL RISK PREVENTION PERTAINING TO THE HOME: ? ?Any stairs in or around the home? Yes  ?If so, are there any without handrails? No  ?Home free of loose throw rugs in walkways, pet beds, electrical cords, etc? Yes  ?Adequate lighting in your home to reduce risk of falls? Yes  ? ?ASSISTIVE DEVICES UTILIZED TO PREVENT FALLS: ? ?Life alert? No  ?Use of a cane, walker or w/c? No  ?Grab bars in the bathroom? No  ?Shower chair or bench in shower? Yes  ?Elevated toilet seat or a handicapped toilet? Yes  ? ?TIMED UP AND GO: ? ?Was the test performed? No . Telephonic visit.  ? ?Cognitive Function: Normal cognitive status assessed by direct observation by this Nurse Health Advisor. No abnormalities found.  ? ?  ?  ? ?  08/13/2019  ? 11:00 AM  ?6CIT Screen  ?What Year? 0 points  ?What month? 0 points  ?What time? 0 points  ?Count back from 20 0 points  ?Months in reverse 0 points  ?Repeat phrase 0 points  ?Total Score 0 points  ? ? ?Immunizations ?Immunization History  ?Administered Date(s) Administered  ? Influenza Inj Mdck Quad Pf 03/31/2018  ? Influenza, High Dose Seasonal PF 04/14/2021  ? Influenza,inj,Quad PF,6+ Mos 03/19/2019  ? Influenza-Unspecified 03/02/2020  ? PFIZER(Purple  Top)SARS-COV-2 Vaccination 08/12/2019, 09/02/2019, 06/29/2020  ? Pension scheme manager 31yr & up 04/14/2021  ? Pneumococcal Conjugate-13 08/29/2020  ? Tdap 04/15/2017  ? Zoster Recombinat (Shingrix)

## 2021-09-29 ENCOUNTER — Other Ambulatory Visit: Payer: Self-pay | Admitting: Family Medicine

## 2021-09-29 DIAGNOSIS — I10 Essential (primary) hypertension: Secondary | ICD-10-CM

## 2021-10-02 ENCOUNTER — Ambulatory Visit: Payer: Medicare HMO | Admitting: Family Medicine

## 2021-10-15 ENCOUNTER — Other Ambulatory Visit: Payer: Self-pay | Admitting: Family Medicine

## 2021-10-15 DIAGNOSIS — I10 Essential (primary) hypertension: Secondary | ICD-10-CM

## 2021-12-05 ENCOUNTER — Other Ambulatory Visit: Payer: Self-pay | Admitting: Physician Assistant

## 2021-12-05 DIAGNOSIS — I10 Essential (primary) hypertension: Secondary | ICD-10-CM

## 2022-02-18 ENCOUNTER — Other Ambulatory Visit: Payer: Self-pay | Admitting: Physician Assistant

## 2022-02-18 DIAGNOSIS — I10 Essential (primary) hypertension: Secondary | ICD-10-CM

## 2022-02-20 NOTE — Telephone Encounter (Signed)
Called pt - made appt for Friday with Dr. Ancil Boozer

## 2022-02-20 NOTE — Telephone Encounter (Signed)
Requested Prescriptions  Pending Prescriptions Disp Refills  . metoprolol succinate (TOPROL-XL) 25 MG 24 hr tablet [Pharmacy Med Name: METOPROLOL ER SUCCINATE '25MG'$  TABS] 90 tablet 0    Sig: TAKE 1 TABLET(25 MG) BY MOUTH DAILY     Cardiovascular:  Beta Blockers Passed - 02/18/2022  6:36 AM      Passed - Last BP in normal range    BP Readings from Last 1 Encounters:  08/21/21 138/72         Passed - Last Heart Rate in normal range    Pulse Readings from Last 1 Encounters:  08/21/21 100         Passed - Valid encounter within last 6 months    Recent Outpatient Visits          6 months ago Essential hypertension, benign   [facility] Mecum, Erin E, PA-C   7 months ago Viral upper respiratory tract infection   [facility] [name], FNP   1 year ago Essential hypertension, benign   [facility] Delsa Grana, PA-C   2 years ago Vitamin D deficiency   [facility] Delsa Grana, PA-C   2 years ago Encounter for [employer] annual wellness exam   [facility] Delsa Grana, [place]      Future Appointments            In 3 days Steele Sizer, MD [facility], [facility]

## 2022-02-21 ENCOUNTER — Encounter: Payer: Self-pay | Admitting: Family Medicine

## 2022-02-22 NOTE — Progress Notes (Unsigned)
Name: Pamela Dixon   MRN: [id number]    DOB: [birth year]   Date:02/22/2022       Progress Note  Subjective  Chief Complaint  Follow Up  I connected with  Pamela Dixon  on 02/22/22 at  3:00 PM EDT by a video enabled telemedicine application and verified that I am speaking with the correct person using two identifiers.  I discussed the limitations of evaluation and management by telemedicine and the availability of in person appointments. The patient expressed understanding and agreed to proceed with the virtual visit  Staff also discussed with the patient that there may be a patient responsible charge related to this service. Patient Location: *** Provider Location: *** Additional Individuals present: ***  HPI  *** 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   Vitamin D deficiency 08/19/2019   Mixed hyperlipidemia 04/21/2019   Essential hypertension, benign 06/10/2018   Class 3 severe obesity with serious comorbidity and body mass index (BMI) of 40.0 to 44.9 in adult [facility]) 06/10/2018    Past Surgical History:  Procedure Laterality Date   ABDOMINAL HYSTERECTOMY  Early 90's   COLONOSCOPY WITH PROPOFOL N/A 03/15/2017   Procedure: COLONOSCOPY WITH PROPOFOL;  Surgeon: Manya Silvas, MD;  Location: [facility] ENDOSCOPY;  Service: Endoscopy;  Laterality: N/A;   TUBAL LIGATION  1985    Family History  Problem Relation Age of Onset   Diabetes Father    Cancer Father        colon skin cancer to head   Breast cancer Neg Hx     Social History   Socioeconomic History   Marital status: Married    Spouse name: Broadus John   Number of children: 2   Years of education: 12   Highest education level: High school graduate  Occupational History   Not on file  Tobacco Use   Smoking status: Never   Smokeless tobacco: Never  Vaping Use   Vaping Use: Never used  Substance and Sexual Activity   Alcohol use: No   Drug use: No   Sexual  activity: Yes  Other Topics Concern   Not on file  Social History Narrative   Not on file   Social Determinants of Health   Financial Resource Strain: Low Risk  (09/28/2021)   Overall Financial Resource Strain (CARDIA)    Difficulty of Paying Living Expenses: Not hard at all  Food Insecurity: No Food Insecurity (09/28/2021)   Hunger Vital Sign    Worried About Running Out of Food in the Last Year: Never true    Ran Out of Food in the Last Year: Never true  Transportation Needs: No Transportation Needs (09/28/2021)   PRAPARE - Hydrologist (Medical): No    Lack of Transportation (Non-Medical): No  Physical Activity: Insufficiently Active (09/28/2021)   Exercise Vital Sign    Days of Exercise per Week: 4 days    Minutes of Exercise per Session: 30 min  Stress: No Stress Concern Present (09/28/2021)   [place]    Feeling of Stress : Not at all  Social Connections: Moderately Integrated (09/28/2021)   Social Connection and Isolation Panel [NHANES]    Frequency of Communication with Friends and Family: More than three times a week    Frequency of Social Gatherings with Friends and Family: Twice a week    Attends Religious Services: More than 4 times  per year    Active Member of Clubs or Organizations: No    Attends Archivist Meetings: Never    Marital Status: Married  Human resources officer Violence: Not At Risk (09/28/2021)   Humiliation, Afraid, Rape, and Kick questionnaire    Fear of Current or Ex-Partner: No    Emotionally Abused: No    Physically Abused: No    Sexually Abused: No     Current Outpatient Medications:    metoprolol succinate (TOPROL-XL) 25 MG 24 hr tablet, TAKE 1 TABLET(25 MG) BY MOUTH DAILY, Disp: 90 tablet, Rfl: 0   aspirin (BAYER ASPIRIN EC LOW DOSE) 81 MG EC tablet, Take 1 tablet (81 mg total) by mouth daily., Disp: , Rfl:    atorvastatin (LIPITOR) 20 MG  tablet, Take every other day 4X a week, Disp: 30 tablet, Rfl: 0   cholecalciferol (VITAMIN D3) 25 MCG (1000 UNIT) tablet, 1 each daily., Disp: , Rfl:    Coenzyme Q10-Vitamin E (QUNOL ULTRA COQ10 PO), , Disp: , Rfl:    losartan-hydrochlorothiazide (HYZAAR) 100-25 MG tablet, TAKE 1 TABLET BY MOUTH EVERY DAY, Disp: 30 tablet, Rfl: 3  No Known Allergies  I personally reviewed active problem list, medication list, allergies, family history, social history, health maintenance with the patient/caregiver today.   ROS ***  Objective  Virtual encounter, vitals not obtained.  There is no height or weight on file to calculate BMI.  Physical Exam ***  No results found for this or any previous visit (from the past 72 hour(s)).  PHQ2/9:    09/28/2021   11:23 AM 08/21/2021    9:01 AM 07/12/2021   11:42 AM 09/20/2020    3:15 PM 08/29/2020   10:23 AM  Depression screen PHQ 2/9  Decreased Interest 0 0 0 0 0  Down, Depressed, Hopeless 0 0 0 0 0  PHQ - 2 Score 0 0 0 0 0   PHQ-2/9 Result is {gen negative/positive:315881}.    Fall Risk:    09/28/2021   11:55 AM 08/21/2021    9:01 AM 07/12/2021   11:42 AM 09/20/2020    3:17 PM 08/29/2020   10:23 AM  Fall Risk   Falls in the past year? 0 0 0 0 0  Number falls in past yr: 0  0 0 0  Injury with Fall? 0  0 0 0  Risk for fall due to : No Fall Risks   No Fall Risks   Follow up Falls prevention discussed Falls prevention discussed Falls evaluation completed Falls prevention discussed Falls evaluation completed     Assessment & Plan  There are no diagnoses linked to this encounter.  I discussed the assessment and treatment plan with the patient. The patient was provided an opportunity to ask questions and all were answered. The patient agreed with the plan and demonstrated an understanding of the instructions.  The patient was advised to call back or seek an in-person evaluation if the symptoms worsen or if the condition fails to improve as  anticipated.  I provided *** minutes of non-face-to-face time during this encounter.

## 2022-02-23 ENCOUNTER — Ambulatory Visit: Payer: Medicare HMO | Admitting: Family Medicine

## 2022-02-23 ENCOUNTER — Encounter: Payer: Medicare HMO | Admitting: Family Medicine

## 2022-02-23 ENCOUNTER — Encounter: Payer: Self-pay | Admitting: Family Medicine

## 2022-02-28 NOTE — Progress Notes (Unsigned)
Name: Pamela Dixon   MRN: [id number]    DOB: April 06, 1956   Date:03/01/2022       Progress Note  Subjective  Chief Complaint  Follow Up  HPI  Dyslipidemia: taking atorvastatin 20 mg every other day, we will change it to 10 mg daily and recheck in 6 months. Denies myalgias, can try stopping CoQ10  HTN: she is taking losartan hctz and toprol XL 25 mg, she has noticed increase in urinary frequency , BP was borderline when she arrived but improved with recheck. We will try stopping HCTZ 25, changing to Benicar 40 and increasing metoprolol to 50 mg daily. She denies chest pain or palpitation  Vitamin D deficiency: continue supplementation  GERD: she has intermittent symptoms and takes Pepcid prn  Urinary frequency: she noticed it over the past month, worse at the end of the day and waking up during the night and unable to make it to the bathroom. She drinks coffee in am and 3 -20 oz bottles of regular Pepsi per day. Discussed importance of cutting down on caffeine, also try to switch to pepsi Zero gradually. We will check ua and urine culture to rule out infection and stop HCTZ. Return in one month for follow up  Morbid obesity: she will try to replaced regular Pepsi with zero sodas   Patient Active Problem List   Diagnosis Date Noted   Gastroesophageal reflux disease 03/01/2022   Urgency incontinence 03/01/2022   Postmenopausal estrogen deficiency 08/19/2019   Vitamin D deficiency 08/19/2019   Mixed hyperlipidemia 04/21/2019   Essential hypertension, benign 06/10/2018   Class 3 severe obesity with serious comorbidity and body mass index (BMI) of 40.0 to 44.9 in adult [facility]) 06/10/2018    Past Surgical History:  Procedure Laterality Date   ABDOMINAL HYSTERECTOMY  Early 90's   COLONOSCOPY WITH PROPOFOL N/A 03/15/2017   Procedure: COLONOSCOPY WITH PROPOFOL;  Surgeon: Manya Silvas, MD;  Location: [facility] ENDOSCOPY;  Service: Endoscopy;  Laterality: N/A;   TUBAL LIGATION  1985    Family  History  Problem Relation Age of Onset   Diabetes Father    Cancer Father        colon skin cancer to head   Breast cancer Neg Hx     Social History   Tobacco Use   Smoking status: Never   Smokeless tobacco: Never  Substance Use Topics   Alcohol use: No     Current Outpatient Medications:    cholecalciferol (VITAMIN D3) 25 MCG (1000 UNIT) tablet, 1 each daily., Disp: , Rfl:    Coenzyme Q10-Vitamin E (QUNOL ULTRA COQ10 PO), , Disp: , Rfl:    olmesartan (BENICAR) 40 MG tablet, Take 1 tablet (40 mg total) by mouth daily., Disp: 30 tablet, Rfl: 0   atorvastatin (LIPITOR) 10 MG tablet, Take 1 tablet (10 mg total) by mouth daily., Disp: 90 tablet, Rfl: 1   metoprolol succinate (TOPROL-XL) 50 MG 24 hr tablet, TAKE 1 TABLET(25 MG) BY MOUTH DAILY, Disp: 30 tablet, Rfl: 0  No Known Allergies  I personally reviewed active problem list, medication list, allergies, family history, social history, health maintenance with the patient/caregiver today.   ROS  Constitutional: Negative for fever or weight change.  Respiratory: Negative for cough and shortness of breath.   Cardiovascular: Negative for chest pain or palpitations.  Gastrointestinal: Negative for abdominal pain, no bowel changes.  Musculoskeletal: Negative for gait problem or joint swelling.  Skin: Negative for rash.  Neurological: Negative for dizziness or headache.  No other specific complaints in a complete review of systems (except as listed in HPI above).   Objective  Vitals:   03/01/22 0853 03/01/22 0911  BP: (!) 140/82 136/78  Pulse: 91   Resp: 16   SpO2: 97%   Weight: 255 lb (115.7 kg)   Height: '5\' 6"'$  (1.676 m)     Body mass index is 41.16 kg/m.  Physical Exam  Constitutional: Patient appears well-developed and well-nourished. Obese  No distress.  HEENT: head atraumatic, normocephalic, pupils equal and reactive to light, neck supple, throat within normal limits Cardiovascular: Normal rate, regular rhythm  and normal heart sounds.  No murmur heard. No BLE edema. Pulmonary/Chest: Effort normal and breath sounds normal. No respiratory distress. Abdominal: Soft.  There is no tenderness. Psychiatric: Patient has a normal mood and affect. behavior is normal. Judgment and thought content normal.   PHQ2/9:    03/01/2022    8:53 AM 02/23/2022   10:37 AM 09/28/2021   11:23 AM 08/21/2021    9:01 AM 07/12/2021   11:42 AM  Depression screen PHQ 2/9  Decreased Interest 0 0 0 0 0  Down, Depressed, Hopeless 0 0 0 0 0  PHQ - 2 Score 0 0 0 0 0  Altered sleeping 0 0     Tired, decreased energy 0 0     Change in appetite 0 0     Feeling bad or failure about yourself  0 0     Trouble concentrating 0 0     Moving slowly or fidgety/restless 0 0     Suicidal thoughts 0 0     PHQ-9 Score 0 0       phq 9 is negative   Fall Risk:    03/01/2022    8:53 AM 02/23/2022   10:37 AM 09/28/2021   11:55 AM 08/21/2021    9:01 AM 07/12/2021   11:42 AM  Fall Risk   Falls in the past year? 0 0 0 0 0  Number falls in past yr: 0 0 0  0  Injury with Fall? 0 0 0  0  Risk for fall due to :  No Fall Risks No Fall Risks    Follow up Falls prevention discussed Falls prevention discussed;Education provided Falls prevention discussed Falls prevention discussed Falls evaluation completed      Functional Status Survey: Is the patient deaf or have difficulty hearing?: No Does the patient have difficulty seeing, even when wearing glasses/contacts?: No Does the patient have difficulty concentrating, remembering, or making decisions?: No Does the patient have difficulty walking or climbing stairs?: No Does the patient have difficulty dressing or bathing?: No Does the patient have difficulty doing errands alone such as visiting a doctor's office or shopping?: No    Assessment & Plan  1. Essential hypertension, benign  - metoprolol succinate (TOPROL-XL) 50 MG 24 hr tablet; TAKE 1 TABLET(25 MG) BY MOUTH DAILY  Dispense: 30  tablet; Refill: 0 - olmesartan (BENICAR) 40 MG tablet; Take 1 tablet (40 mg total) by mouth daily.  Dispense: 30 tablet; Refill: 0  2. Class 3 severe obesity due to excess calories with serious comorbidity and body mass index (BMI) of 40.0 to 44.9 in adult [facility])  Discussed healthier diet   3. Urgency incontinence  - CULTURE, URINE COMPREHENSIVE - Urinalysis, Complete  4. Need for pneumococcal vaccination  - Pneumococcal conjugate vaccine 20-valent (Prevnar 20)  5. Need for influenza vaccination  - Flu Vaccine QUAD High Dose(Fluad)  6. Mixed  hyperlipidemia  - atorvastatin (LIPITOR) 10 MG tablet; Take 1 tablet (10 mg total) by mouth daily.  Dispense: 90 tablet; Refill: 1  7. Vitamin D deficiency   8. Gastroesophageal reflux disease, unspecified whether esophagitis present  .

## 2022-03-01 ENCOUNTER — Encounter: Payer: Self-pay | Admitting: Family Medicine

## 2022-03-01 ENCOUNTER — Ambulatory Visit (INDEPENDENT_AMBULATORY_CARE_PROVIDER_SITE_OTHER): Payer: Medicare HMO | Admitting: Family Medicine

## 2022-03-01 VITALS — BP 136/78 | HR 91 | Resp 16 | Ht 66.0 in | Wt 255.0 lb

## 2022-03-01 DIAGNOSIS — E559 Vitamin D deficiency, unspecified: Secondary | ICD-10-CM

## 2022-03-01 DIAGNOSIS — I10 Essential (primary) hypertension: Secondary | ICD-10-CM

## 2022-03-01 DIAGNOSIS — Z23 Encounter for immunization: Secondary | ICD-10-CM

## 2022-03-01 DIAGNOSIS — N3941 Urge incontinence: Secondary | ICD-10-CM | POA: Diagnosis not present

## 2022-03-01 DIAGNOSIS — Z6841 Body Mass Index (BMI) 40.0 and over, adult: Secondary | ICD-10-CM

## 2022-03-01 DIAGNOSIS — E782 Mixed hyperlipidemia: Secondary | ICD-10-CM

## 2022-03-01 DIAGNOSIS — K219 Gastro-esophageal reflux disease without esophagitis: Secondary | ICD-10-CM

## 2022-03-01 HISTORY — DX: Gastro-esophageal reflux disease without esophagitis: K21.9

## 2022-03-01 MED ORDER — METOPROLOL SUCCINATE ER 50 MG PO TB24: ORAL_TABLET | ORAL | 0 refills | Status: DC

## 2022-03-01 MED ORDER — ATORVASTATIN CALCIUM 10 MG PO TABS: 10.0000 mg | ORAL_TABLET | Freq: Every day | ORAL | 1 refills | Status: DC

## 2022-03-01 MED ORDER — OLMESARTAN MEDOXOMIL 40 MG PO TABS: 40.0000 mg | ORAL_TABLET | Freq: Every day | ORAL | 0 refills | Status: DC

## 2022-03-04 ENCOUNTER — Encounter: Payer: Self-pay | Admitting: Family Medicine

## 2022-03-28 NOTE — Progress Notes (Unsigned)
Name: Pamela Dixon   MRN: [id number]    DOB: May 07, 1956   Date:03/29/2022       Progress Note  Subjective  Chief Complaint  Follow Up  HPI  HTN: she  was taking losartan hctz and toprol XL 25 mg, she was noticing increase in noticed increase in urinary frequency , we switched to Benicar 40 mg and continue metoprolol but at 50 mg dose, bp is better today, she states urinary frequency has decreased and is only getting up once at night, no swelling since she stopped HCTZ. She is happy with results.    Patient Active Problem List   Diagnosis Date Noted   Gastroesophageal reflux disease 03/01/2022   Urgency incontinence 03/01/2022   Postmenopausal estrogen deficiency 08/19/2019   Vitamin D deficiency 08/19/2019   Mixed hyperlipidemia 04/21/2019   Essential hypertension, benign 06/10/2018   Class 3 severe obesity with serious comorbidity and body mass index (BMI) of 40.0 to 44.9 in adult [facility]) 06/10/2018    Past Surgical History:  Procedure Laterality Date   ABDOMINAL HYSTERECTOMY  Early 90's   COLONOSCOPY WITH PROPOFOL N/A 03/15/2017   Procedure: COLONOSCOPY WITH PROPOFOL;  Surgeon: Manya Silvas, MD;  Location: [facility] ENDOSCOPY;  Service: Endoscopy;  Laterality: N/A;   TUBAL LIGATION  1985    Family History  Problem Relation Age of Onset   Diabetes Father    Cancer Father        colon skin cancer to head   Breast cancer Neg Hx     Social History   Tobacco Use   Smoking status: Never   Smokeless tobacco: Never  Substance Use Topics   Alcohol use: No     Current Outpatient Medications:    atorvastatin (LIPITOR) 10 MG tablet, Take 1 tablet (10 mg total) by mouth daily., Disp: 90 tablet, Rfl: 1   cholecalciferol (VITAMIN D3) 25 MCG (1000 UNIT) tablet, 1 each daily., Disp: , Rfl:    Coenzyme Q10-Vitamin E (QUNOL ULTRA COQ10 PO), , Disp: , Rfl:    metoprolol succinate (TOPROL-XL) 50 MG 24 hr tablet, TAKE 1 TABLET(25 MG) BY MOUTH DAILY, Disp: 30 tablet, Rfl: 0    olmesartan (BENICAR) 40 MG tablet, Take 1 tablet (40 mg total) by mouth daily., Disp: 30 tablet, Rfl: 0  No Known Allergies  I personally reviewed active problem list, medication list, allergies, family history, social history, health maintenance with the patient/caregiver today.   ROS  Ten systems reviewed and is negative except as mentioned in HPI   Objective  Vitals:   03/29/22 0946  BP: 134/86  Pulse: 95  Resp: 16  SpO2: 95%  Weight: 255 lb (115.7 kg)  Height: '5\' 6"'$  (1.676 m)    Body mass index is 41.16 kg/m.  Physical Exam  Constitutional: Patient appears well-developed and well-nourished. Obese  No distress.  HEENT: head atraumatic, normocephalic, pupils equal and reactive to light,, neck supple Cardiovascular: Normal rate, regular rhythm and normal heart sounds.  No murmur heard. No BLE edema. Pulmonary/Chest: Effort normal and breath sounds normal. No respiratory distress. Abdominal: Soft.  There is no tenderness. Psychiatric: Patient has a normal mood and affect. behavior is normal. Judgment and thought content normal.   PHQ2/9:    03/29/2022    9:45 AM 03/01/2022    8:53 AM 02/23/2022   10:37 AM 09/28/2021   11:23 AM 08/21/2021    9:01 AM  Depression screen PHQ 2/9  Decreased Interest 0 0 0 0 0  Down, Depressed, Hopeless  0 0 0 0 0  PHQ - 2 Score 0 0 0 0 0  Altered sleeping 0 0 0    Tired, decreased energy 0 0 0    Change in appetite 0 0 0    Feeling bad or failure about yourself  0 0 0    Trouble concentrating 0 0 0    Moving slowly or fidgety/restless 0 0 0    Suicidal thoughts 0 0 0    PHQ-9 Score 0 0 0      phq 9 is negative   Fall Risk:    03/29/2022    9:45 AM 03/01/2022    8:53 AM 02/23/2022   10:37 AM 09/28/2021   11:55 AM 08/21/2021    9:01 AM  Fall Risk   Falls in the past year? 0 0 0 0 0  Number falls in past yr: 0 0 0 0   Injury with Fall? 0 0 0 0   Risk for fall due to : No Fall Risks  No Fall Risks No Fall Risks   Follow up Falls  prevention discussed Falls prevention discussed Falls prevention discussed;Education provided Falls prevention discussed Falls prevention discussed      Functional Status Survey: Is the patient deaf or have difficulty hearing?: No Does the patient have difficulty seeing, even when wearing glasses/contacts?: No Does the patient have difficulty concentrating, remembering, or making decisions?: No Does the patient have difficulty walking or climbing stairs?: No Does the patient have difficulty dressing or bathing?: No Does the patient have difficulty doing errands alone such as visiting a doctor's office or shopping?: No    Assessment & Plan  1. Essential hypertension, benign  - olmesartan (BENICAR) 40 MG tablet; Take 1 tablet (40 mg total) by mouth daily.  Dispense: 90 tablet; Refill: 1 - metoprolol succinate (TOPROL-XL) 50 MG 24 hr tablet; Take 1 tablet (50 mg total) by mouth daily.  Dispense: 90 tablet; Refill: 1 .

## 2022-03-29 ENCOUNTER — Encounter: Payer: Self-pay | Admitting: Family Medicine

## 2022-03-29 ENCOUNTER — Ambulatory Visit (INDEPENDENT_AMBULATORY_CARE_PROVIDER_SITE_OTHER): Payer: Medicare HMO | Admitting: Family Medicine

## 2022-03-29 DIAGNOSIS — I10 Essential (primary) hypertension: Secondary | ICD-10-CM

## 2022-03-29 MED ORDER — OLMESARTAN MEDOXOMIL 40 MG PO TABS: 40.0000 mg | ORAL_TABLET | Freq: Every day | ORAL | 1 refills | Status: DC

## 2022-03-29 MED ORDER — METOPROLOL SUCCINATE ER 50 MG PO TB24: 50.0000 mg | ORAL_TABLET | Freq: Every day | ORAL | 1 refills | Status: DC

## 2022-05-08 ENCOUNTER — Encounter: Payer: Self-pay | Admitting: Family Medicine

## 2022-05-28 ENCOUNTER — Ambulatory Visit: Payer: Medicare HMO | Admitting: Family Medicine

## 2022-07-03 ENCOUNTER — Telehealth: Payer: Medicare HMO | Admitting: Physician Assistant

## 2022-07-03 DIAGNOSIS — R6889 Other general symptoms and signs: Secondary | ICD-10-CM | POA: Diagnosis not present

## 2022-07-03 MED ORDER — FLUTICASONE PROPIONATE 50 MCG/ACT NA SUSP: 2.0000 | Freq: Every day | NASAL | 0 refills | Status: DC

## 2022-07-03 MED ORDER — BENZONATATE 100 MG PO CAPS: 100.0000 mg | ORAL_CAPSULE | Freq: Three times a day (TID) | ORAL | 0 refills | Status: DC | PRN

## 2022-07-03 NOTE — Progress Notes (Signed)
E visit for Flu like symptoms   We are sorry that you are not feeling well.  Here is how we plan to help! Based on what you have shared with me it looks like you may have flu-like symptoms that should be watched but do not seem to indicate anti-viral treatment.  Influenza or "the flu" is   an infection caused by a respiratory virus. The flu virus is highly contagious and persons who did not receive their yearly flu vaccination may "catch" the flu from close contact.  We have anti-viral medications to treat the viruses that cause this infection. They are not a "cure" and only shorten the course of the infection. These prescriptions are most effective when they are given within the first 2 days of "flu" symptoms. Antiviral medication are indicated if you have a high risk of complications from the flu. You should  also consider an antiviral medication if you are in close contact with someone who is at risk. These medications can help patients avoid complications from the flu  but have side effects that you should know. Possible side effects from Tamiflu or oseltamivir include nausea, vomiting, diarrhea, dizziness, headaches, eye redness, sleep problems or other respiratory symptoms. You should not take Tamiflu if you have an allergy to oseltamivir or any to the ingredients in Tamiflu.  Based upon your symptoms and potential risk factors I recommend that you follow the flu symptoms recommendation that I have listed below.  This is an infection that is most likely caused by a virus. There are no specific treatments other than to help you with the symptoms until the infection runs its course.  We are sorry you are not feeling well.  Here is how we plan to help!  For nasal congestion, you may use an oral decongestants such as Mucinex D or if you have glaucoma or high blood pressure use plain Mucinex.  Saline nasal spray or nasal drops can help and can safely be used as often as needed for congestion.  For  your congestion, I have prescribed Fluticasone nasal spray one spray in each nostril twice a day  If you do not have a history of heart disease, hypertension, diabetes or thyroid disease, prostate/bladder issues or glaucoma, you may also use Sudafed to treat nasal congestion.  It is highly recommended that you consult with a pharmacist or your primary care physician to ensure this medication is safe for you to take.     If you have a cough, you may use cough suppressants such as Delsym and Robitussin.  If you have glaucoma or high blood pressure, you can also use Coricidin HBP.   For cough I have prescribed for you A prescription cough medication called Tessalon Perles 100 mg. You may take 1-2 capsules every 8 hours as needed for cough  If you have a sore or scratchy throat, use a saltwater gargle-  to  teaspoon of salt dissolved in a 4-ounce to 8-ounce glass of warm water.  Gargle the solution for approximately 15-30 seconds and then spit.  It is important not to swallow the solution.  You can also use throat lozenges/cough drops and Chloraseptic spray to help with throat pain or discomfort.  Warm or cold liquids can also be helpful in relieving throat pain.  For headache, pain or general discomfort, you can use Ibuprofen or Tylenol as directed.   Some authorities believe that zinc sprays or the use of Echinacea may shorten the course of your symptoms.  ANYONE WHO HAS FLU SYMPTOMS SHOULD: Stay home. The flu is highly contagious and going out or to work exposes others! Be sure to drink plenty of fluids. Water is fine as well as fruit juices, sodas and electrolyte beverages. You may want to stay away from caffeine or alcohol. If you are nauseated, try taking small sips of liquids. How do you know if you are getting enough fluid? Your urine should be a pale yellow or almost colorless. Get rest. Taking a steamy shower or using a humidifier may help nasal congestion and ease sore throat pain. Using a  saline nasal spray works much the same way. Cough drops, hard candies and sore throat lozenges may ease your cough. Line up a caregiver. Have someone check on you regularly.   GET HELP RIGHT AWAY IF: You cannot keep down liquids or your medications. You become short of breath Your fell like you are going to pass out or loose consciousness. Your symptoms persist after you have completed your treatment plan MAKE SURE YOU  Understand these instructions. Will watch your condition. Will get help right away if you are not doing well or get worse.  Your e-visit answers were reviewed by a board certified advanced clinical practitioner to complete your personal care plan.  Depending on the condition, your plan could have included both over the counter or prescription medications.  If there is a problem please reply  once you have received a response from your provider.  Your safety is important to [place].  If you have drug allergies check your prescription carefully.    You can use MyChart to ask questions about today's visit, request a non-urgent call back, or ask for a work or school excuse for 24 hours related to this e-Visit. If it has been greater than 24 hours you will need to follow up with your provider, or enter a new e-Visit to address those concerns.  You will get an e-mail in the next two days asking about your experience.  I hope that your e-visit has been valuable and will speed your recovery. Thank you for using e-visits.  I have spent 5 minutes in review of e-visit questionnaire, review and updating patient chart, medical decision making and response to patient.   Mar Daring, PA-C

## 2022-07-11 ENCOUNTER — Other Ambulatory Visit: Payer: Self-pay | Admitting: Family Medicine

## 2022-07-11 DIAGNOSIS — Z1231 Encounter for screening mammogram for malignant neoplasm of breast: Secondary | ICD-10-CM

## 2022-07-31 ENCOUNTER — Ambulatory Visit
Admission: RE | Admit: 2022-07-31 | Discharge: 2022-07-31 | Disposition: A | Discharge status: Home / Self Care | Payer: Medicare HMO | Source: Ambulatory Visit | Attending: Family Medicine | Admitting: Family Medicine

## 2022-07-31 DIAGNOSIS — Z1231 Encounter for screening mammogram for malignant neoplasm of breast: Secondary | ICD-10-CM

## 2022-08-23 ENCOUNTER — Encounter: Payer: Self-pay | Admitting: Family Medicine

## 2022-08-28 ENCOUNTER — Ambulatory Visit: Payer: Medicare HMO | Admitting: Family Medicine

## 2022-08-29 ENCOUNTER — Telehealth: Payer: Self-pay | Admitting: Family Medicine

## 2022-08-29 NOTE — Telephone Encounter (Signed)
Contacted Pamela Dixon to schedule their annual wellness visit. Appointment made for 10/04/2022.  [place] Direct Dial: [phone]

## 2022-08-29 NOTE — Progress Notes (Signed)
Name: Pamela Dixon   MRN: [id number]    DOB: 1955-10-05   Date:08/30/2022       Progress Note  Subjective  Chief Complaint  Follow Up  HPI  Dyslipidemia: she is now taking Atorvastatin 10 mg daily and denies myalgias.   HTN: she  was taking losartan hctz and toprol XL 25 mg, she was noticing increase in noticed increase in urinary frequency , we switched to Benicar 40 mg and continue metoprolol with increase dose of  50 mg dose, she has also decreased fluid intake before bed time and nocturia has improved. She is happy with current regiment No chest pain or palpitation   Vitamin D deficiency: continue supplementation  GERD: she has intermittent symptoms and takes Pepcid prn without side effects   Morbid obesity: she is drinking less sodas, she has been trying to be more active   Patient Active Problem List   Diagnosis Date Noted   Gastroesophageal reflux disease 03/01/2022   Urgency incontinence 03/01/2022   Postmenopausal estrogen deficiency 08/19/2019   Vitamin D deficiency 08/19/2019   Mixed hyperlipidemia 04/21/2019   Essential hypertension, benign 06/10/2018   Class 3 severe obesity with serious comorbidity and body mass index (BMI) of 40.0 to 44.9 in adult [facility]) 06/10/2018    Past Surgical History:  Procedure Laterality Date   ABDOMINAL HYSTERECTOMY  Early 90's   COLONOSCOPY WITH PROPOFOL N/A 03/15/2017   Procedure: COLONOSCOPY WITH PROPOFOL;  Surgeon: Manya Silvas, MD;  Location: [facility] ENDOSCOPY;  Service: Endoscopy;  Laterality: N/A;   TUBAL LIGATION  1985    Family History  Problem Relation Age of Onset   Diabetes Father    Cancer Father        colon skin cancer to head   Breast cancer Neg Hx     Social History   Tobacco Use   Smoking status: Never   Smokeless tobacco: Never  Substance Use Topics   Alcohol use: No     Current Outpatient Medications:    cholecalciferol (VITAMIN D3) 25 MCG (1000 UNIT) tablet, 1 each daily., Disp: , Rfl:    Coenzyme  Q10-Vitamin E (QUNOL ULTRA COQ10 PO), , Disp: , Rfl:    atorvastatin (LIPITOR) 10 MG tablet, Take 1 tablet (10 mg total) by mouth daily., Disp: 90 tablet, Rfl: 1   fluticasone (FLONASE) 50 MCG/ACT nasal spray, Place 2 sprays into both nostrils daily. (Patient not taking: Reported on 08/30/2022), Disp: 16 g, Rfl: 0   metoprolol succinate (TOPROL-XL) 50 MG 24 hr tablet, Take 1 tablet (50 mg total) by mouth daily., Disp: 90 tablet, Rfl: 1   olmesartan (BENICAR) 40 MG tablet, Take 1 tablet (40 mg total) by mouth daily., Disp: 90 tablet, Rfl: 1  No Known Allergies  I personally reviewed active problem list, medication list, allergies, family history, social history, health maintenance with the patient/caregiver today.   ROS  Ten systems reviewed and is negative except as mentioned in HPI   Objective  Vitals:   08/30/22 1122  BP: 134/82  Pulse: 85  Resp: 16  Temp: 98.1 F (36.7 C)  TempSrc: Oral  SpO2: 97%  Weight: 260 lb (117.9 kg)  Height: 5\' 6"  (1.676 m)    Body mass index is 41.97 kg/m.  Physical Exam  Constitutional: Patient appears well-developed and well-nourished. Obese  No distress.  HEENT: head atraumatic, normocephalic, pupils equal and reactive to light,, neck supple Cardiovascular: Normal rate, regular rhythm and normal heart sounds.  No murmur heard. No BLE  edema. Pulmonary/Chest: Effort normal and breath sounds normal. No respiratory distress. Abdominal: Soft.  There is no tenderness. Psychiatric: Patient has a normal mood and affect. behavior is normal. Judgment and thought content normal.  PHQ2/9:    08/30/2022   11:16 AM 03/29/2022    9:45 AM 03/01/2022    8:53 AM 02/23/2022   10:37 AM 09/28/2021   11:23 AM  Depression screen PHQ 2/9  Decreased Interest 0 0 0 0 0  Down, Depressed, Hopeless 0 0 0 0 0  PHQ - 2 Score 0 0 0 0 0  Altered sleeping 0 0 0 0   Tired, decreased energy 0 0 0 0   Change in appetite 0 0 0 0   Feeling bad or failure about yourself  0  0 0 0   Trouble concentrating 0 0 0 0   Moving slowly or fidgety/restless 0 0 0 0   Suicidal thoughts 0 0 0 0   PHQ-9 Score 0 0 0 0     phq 9 is negative   Fall Risk:    08/30/2022   11:16 AM 03/29/2022    9:45 AM 03/01/2022    8:53 AM 02/23/2022   10:37 AM 09/28/2021   11:55 AM  Fall Risk   Falls in the past year? 0 0 0 0 0  Number falls in past yr: 0 0 0 0 0  Injury with Fall? 0 0 0 0 0  Risk for fall due to : No Fall Risks No Fall Risks  No Fall Risks No Fall Risks  Follow up Falls prevention discussed Falls prevention discussed Falls prevention discussed Falls prevention discussed;Education provided Falls prevention discussed      Functional Status Survey: Is the patient deaf or have difficulty hearing?: No Does the patient have difficulty seeing, even when wearing glasses/contacts?: No Does the patient have difficulty concentrating, remembering, or making decisions?: No Does the patient have difficulty walking or climbing stairs?: No Does the patient have difficulty dressing or bathing?: No Does the patient have difficulty doing errands alone such as visiting a doctor's office or shopping?: No    Assessment & Plan  1. Essential hypertension, benign  - metoprolol succinate (TOPROL-XL) 50 MG 24 hr tablet; Take 1 tablet (50 mg total) by mouth daily.  Dispense: 90 tablet; Refill: 1 - olmesartan (BENICAR) 40 MG tablet; Take 1 tablet (40 mg total) by mouth daily.  Dispense: 90 tablet; Refill: 1  2. Vitamin D deficiency  - VITAMIN D 25 Hydroxy (Vit-D Deficiency, Fractures)  3. Gastroesophageal reflux disease, unspecified whether esophagitis present  Doing well on prn medication  4. Mixed hyperlipidemia  - Lipid panel - atorvastatin (LIPITOR) 10 MG tablet; Take 1 tablet (10 mg total) by mouth daily.  Dispense: 90 tablet; Refill: 1  5. Long-term use of high-risk medication  - CBC with Differential/Platelet - COMPLETE METABOLIC PANEL WITH GFR  6.  Hyperglycemia  - Hemoglobin A1c  7. Essential hypertension, benign  - metoprolol succinate (TOPROL-XL) 50 MG 24 hr tablet; Take 1 tablet (50 mg total) by mouth daily.  Dispense: 90 tablet; Refill: 1 - olmesartan (BENICAR) 40 MG tablet; Take 1 tablet (40 mg total) by mouth daily.  Dispense: 90 tablet; Refill: 1

## 2022-08-29 NOTE — Telephone Encounter (Signed)
Contacted Pamela Dixon to schedule their annual wellness visit. Appointment made for 10/04/2022.  [place] Direct Dial: [phone]

## 2022-08-30 ENCOUNTER — Encounter: Payer: Self-pay | Admitting: Family Medicine

## 2022-08-30 ENCOUNTER — Ambulatory Visit (INDEPENDENT_AMBULATORY_CARE_PROVIDER_SITE_OTHER): Payer: Medicare HMO | Admitting: Family Medicine

## 2022-08-30 VITALS — BP 134/82 | HR 85 | Temp 98.1°F | Resp 16 | Ht 66.0 in | Wt 260.0 lb

## 2022-08-30 DIAGNOSIS — E559 Vitamin D deficiency, unspecified: Secondary | ICD-10-CM

## 2022-08-30 DIAGNOSIS — Z79899 Other long term (current) drug therapy: Secondary | ICD-10-CM

## 2022-08-30 DIAGNOSIS — I10 Essential (primary) hypertension: Secondary | ICD-10-CM | POA: Diagnosis not present

## 2022-08-30 DIAGNOSIS — E782 Mixed hyperlipidemia: Secondary | ICD-10-CM | POA: Diagnosis not present

## 2022-08-30 DIAGNOSIS — K219 Gastro-esophageal reflux disease without esophagitis: Secondary | ICD-10-CM | POA: Diagnosis not present

## 2022-08-30 DIAGNOSIS — R739 Hyperglycemia, unspecified: Secondary | ICD-10-CM

## 2022-08-30 MED ORDER — METOPROLOL SUCCINATE ER 50 MG PO TB24: 50.0000 mg | ORAL_TABLET | Freq: Every day | ORAL | 1 refills | Status: DC

## 2022-08-30 MED ORDER — OLMESARTAN MEDOXOMIL 40 MG PO TABS: 40.0000 mg | ORAL_TABLET | Freq: Every day | ORAL | 1 refills | Status: DC

## 2022-08-30 MED ORDER — ATORVASTATIN CALCIUM 10 MG PO TABS: 10.0000 mg | ORAL_TABLET | Freq: Every day | ORAL | 1 refills | Status: DC

## 2022-08-31 ENCOUNTER — Ambulatory Visit: Payer: Medicare HMO | Admitting: Nurse Practitioner

## 2022-08-31 LAB — COMPLETE METABOLIC PANEL WITH GFR
AG Ratio: 1.6 (calc) (ref 1.0–2.5)
ALT: 15 U/L (ref 6–29)
AST: 14 U/L (ref 10–35)
Albumin: 4.3 g/dL (ref 3.6–5.1)
Alkaline phosphatase (APISO): 50 U/L (ref 37–153)
BUN: 16 mg/dL (ref 7–25)
CO2: 25 mmol/L (ref 20–32)
Calcium: 9.4 mg/dL (ref 8.6–10.4)
Chloride: 105 mmol/L (ref 98–110)
Creat: 0.68 mg/dL (ref 0.50–1.05)
Globulin: 2.7 g/dL (calc) (ref 1.9–3.7)
Glucose, Bld: 102 mg/dL — ABNORMAL HIGH (ref 65–99)
Potassium: 4.6 mmol/L (ref 3.5–5.3)
Sodium: 140 mmol/L (ref 135–146)
Total Bilirubin: 0.5 mg/dL (ref 0.2–1.2)
Total Protein: 7 g/dL (ref 6.1–8.1)
eGFR: 95 mL/min/{1.73_m2} (ref >=60)

## 2022-08-31 LAB — CBC WITH DIFFERENTIAL/PLATELET
Absolute Monocytes: 581 cells/uL (ref 200–950)
Basophils Absolute: 79 cells/uL (ref 0–200)
Basophils Relative: 1.2 %
Eosinophils Absolute: 330 cells/uL (ref 15–500)
Eosinophils Relative: 5 %
HCT: 45.5 % — ABNORMAL HIGH (ref 35.0–45.0)
Hemoglobin: 15.1 g/dL (ref 11.7–15.5)
Lymphs Abs: 3029 cells/uL (ref 850–3900)
MCH: 30.6 pg (ref 27.0–33.0)
MCHC: 33.2 g/dL (ref 32.0–36.0)
MCV: 92.3 fL (ref 80.0–100.0)
MPV: 9.4 fL (ref 7.5–12.5)
Monocytes Relative: 8.8 %
Neutro Abs: 2581 cells/uL (ref 1500–7800)
Neutrophils Relative %: 39.1 %
Platelets: 282 10*3/uL (ref 140–400)
RBC: 4.93 10*6/uL (ref 3.80–5.10)
RDW: 11.7 % (ref 11.0–15.0)
Total Lymphocyte: 45.9 %
WBC: 6.6 10*3/uL (ref 3.8–10.8)

## 2022-08-31 LAB — LIPID PANEL
Cholesterol: 156 mg/dL (ref <200)
HDL: 49 mg/dL — ABNORMAL LOW (ref >=50)
LDL Cholesterol (Calc): 83 mg/dL (calc)
Non-HDL Cholesterol (Calc): 107 mg/dL (calc) (ref <130)
Total CHOL/HDL Ratio: 3.2 (calc) (ref <5.0)
Triglycerides: 141 mg/dL (ref <150)

## 2022-08-31 LAB — HEMOGLOBIN A1C
Hgb A1c MFr Bld: 5.6 % of total Hgb (ref <5.7)
Mean Plasma Glucose: 114 mg/dL
eAG (mmol/L): 6.3 mmol/L

## 2022-08-31 LAB — VITAMIN D 25 HYDROXY (VIT D DEFICIENCY, FRACTURES): Vit D, 25-Hydroxy: 35 ng/mL (ref 30–100)

## 2022-09-05 ENCOUNTER — Ambulatory Visit
Admission: RE | Admit: 2022-09-05 | Discharge: 2022-09-05 | Disposition: A | Discharge status: Home / Self Care | Payer: Medicare HMO | Source: Ambulatory Visit | Attending: Family Medicine | Admitting: Family Medicine

## 2022-09-05 ENCOUNTER — Encounter: Payer: Self-pay | Admitting: Family Medicine

## 2022-09-05 DIAGNOSIS — Z780 Asymptomatic menopausal state: Secondary | ICD-10-CM | POA: Diagnosis not present

## 2023-02-07 ENCOUNTER — Ambulatory Visit (INDEPENDENT_AMBULATORY_CARE_PROVIDER_SITE_OTHER): Payer: Medicare HMO

## 2023-02-07 VITALS — Ht 67.0 in | Wt 260.0 lb

## 2023-02-07 DIAGNOSIS — Z0000 Encounter for general adult medical examination without abnormal findings: Secondary | ICD-10-CM

## 2023-02-07 NOTE — Progress Notes (Signed)
Subjective:   Pamela Dixon is a 67 y.o. female who presents for Medicare Annual (Subsequent) preventive examination.  Visit Complete: Virtual  I connected with  Pamela Dixon on 02/07/23 by a audio enabled telemedicine application and verified that I am speaking with the correct person using two identifiers.  Patient Location: Home  Provider Location: Office/Clinic  I discussed the limitations of evaluation and management by telemedicine. The patient expressed understanding and agreed to proceed.  Vital Signs: Unable to obtain new vitals due to this being a telehealth visit.  Patient Medicare AWV questionnaire was completed by the patient on 02/06/23; I have confirmed that all information answered by patient is correct and no changes since this date.  Review of Systems         Objective:    Today's Vitals   02/07/23 0953  Weight: 260 lb (117.9 kg)  Height: 5\' 7"  (1.702 m)   Body mass index is 40.72 kg/m.     02/07/2023    9:58 AM 09/28/2021   11:25 AM 09/20/2020    3:16 PM 08/19/2019   10:59 PM 03/15/2017    9:41 AM  Advanced Directives  Does Patient Have a Medical Advance Directive? No No No No No  Does patient want to make changes to medical advance directive?  No - Patient declined     Would patient like information on creating a medical advance directive?   Yes (MAU/Ambulatory/Procedural Areas - Information given) Yes (MAU/Ambulatory/Procedural Areas - Information given)     Current Medications (verified) Outpatient Encounter Medications as of 02/07/2023  Medication Sig   atorvastatin (LIPITOR) 10 MG tablet Take 1 tablet (10 mg total) by mouth daily.   cholecalciferol (VITAMIN D3) 25 MCG (1000 UNIT) tablet 1 each daily.   Coenzyme Q10-Vitamin E (QUNOL ULTRA COQ10 PO)    metoprolol succinate (TOPROL-XL) 50 MG 24 hr tablet Take 1 tablet (50 mg total) by mouth daily.   olmesartan (BENICAR) 40 MG tablet Take 1 tablet (40 mg total) by mouth daily.   fluticasone  (FLONASE) 50 MCG/ACT nasal spray Place 2 sprays into both nostrils daily. (Patient not taking: Reported on 08/30/2022)   No facility-administered encounter medications on file as of 02/07/2023.    Allergies (verified) Patient has no known allergies.   History: Past Medical History:  Diagnosis Date   Gastroesophageal reflux disease 03/01/2022   High cholesterol    Hypertension    Mixed hyperlipidemia    Past Surgical History:  Procedure Laterality Date   ABDOMINAL HYSTERECTOMY  Early 90's   COLONOSCOPY WITH PROPOFOL N/A 03/15/2017   Procedure: COLONOSCOPY WITH PROPOFOL;  Surgeon: Scot Jun, MD;  Location: [facility] ENDOSCOPY;  Service: Endoscopy;  Laterality: N/A;   TUBAL LIGATION  1985   Family History  Problem Relation Age of Onset   Diabetes Father    Cancer Father        colon skin cancer to head   Breast cancer Neg Hx    Social History   Socioeconomic History   Marital status: Married    Spouse name: Pamela Dixon   Number of children: 2   Years of education: 12   Highest education level: High school graduate  Occupational History   Not on file  Tobacco Use   Smoking status: Never   Smokeless tobacco: Never  Vaping Use   Vaping status: Never Used  Substance and Sexual Activity   Alcohol use: No   Drug use: No   Sexual activity: Yes  Other  Topics Concern   Not on file  Social History Narrative   Not on file   Social Determinants of Health   Financial Resource Strain: Low Risk  (02/06/2023)   Overall Financial Resource Strain (CARDIA)    Difficulty of Paying Living Expenses: Not hard at all  Food Insecurity: No Food Insecurity (02/06/2023)   Hunger Vital Sign    Worried About Running Out of Food in the Last Year: Never true    Ran Out of Food in the Last Year: Never true  Transportation Needs: No Transportation Needs (02/06/2023)   PRAPARE - Administrator, Civil Service (Medical): No    Lack of Transportation (Non-Medical): No  Physical Activity:  Insufficiently Active (02/06/2023)   Exercise Vital Sign    Days of Exercise per Week: 3 days    Minutes of Exercise per Session: 20 min  Stress: No Stress Concern Present (02/06/2023)   Pamela Dixon of Occupational Health - Occupational Stress Questionnaire    Feeling of Stress : Not at all  Social Connections: Unknown (02/06/2023)   Social Connection and Isolation Panel [NHANES]    Frequency of Communication with Friends and Family: More than three times a week    Frequency of Social Gatherings with Friends and Family: Once a week    Attends Religious Services: Not on Marketing executive or Organizations: Not on file    Attends Banker Meetings: Not on file    Marital Status: Married    Tobacco Counseling Counseling given: Not Answered   Clinical Intake:  Pre-visit preparation completed: Yes  Pain : No/denies pain   BMI - recorded: 40.72 Nutritional Status: BMI > 30  Obese Nutritional Risks: None Diabetes: No  How often do you need to have someone help you when you read instructions, pamphlets, or other written materials from your doctor or pharmacy?: 1 - Never  Interpreter Needed?: No  Comments: lives with husband Information entered by :: B.Ingri Diemer,LPN   Activities of Daily Living    02/06/2023    7:41 AM 10/01/2022   10:18 AM  In your present state of health, do you have any difficulty performing the following activities:  Hearing? 0 0  Vision? 0 0  Difficulty concentrating or making decisions? 0 0  Walking or climbing stairs? 0 0  Dressing or bathing? 0 0  Doing errands, shopping? 0 0  Preparing Food and eating ? N N  Using the Toilet? N N  In the past six months, have you accidently leaked urine? Y Y  Do you have problems with loss of bowel control? N N  Managing your Medications? N N  Managing your Finances? N N  Housekeeping or managing your Housekeeping? N N    Patient Care Team: Alba Cory, MD as PCP - General  (Family Medicine) [facility]  Indicate any recent Medical Services you may have received from other than [facility] providers in the past year (date may be approximate).     Assessment:   This is a routine wellness examination for Pamela Dixon.  Hearing/Vision screen Hearing Screening - Comments:: Adequate hearing Vision Screening - Comments:: Adequate vision with glasses Dr [facility] at [facility]  Dietary issues and exercise activities discussed:     Goals Addressed             This Visit's Progress    DIET - INCREASE WATER INTAKE   Not on track    Recommend drinking 6-8  glasses of water per day        Depression Screen    02/07/2023    9:57 AM 08/30/2022   11:16 AM 03/29/2022    9:45 AM 03/01/2022    8:53 AM 02/23/2022   10:37 AM 09/28/2021   11:23 AM 08/21/2021    9:01 AM  PHQ 2/9 Scores  PHQ - 2 Score 0 0 0 0 0 0 0  PHQ- 9 Score  0 0 0 0      Fall Risk    02/06/2023    7:41 AM 10/01/2022   10:18 AM 08/30/2022   11:16 AM 03/29/2022    9:45 AM 03/01/2022    8:53 AM  Fall Risk   Falls in the past year? 0 0 0 0 0  Number falls in past yr: 0  0 0 0  Injury with Fall? 0  0 0 0  Risk for fall due to : No Fall Risks  No Fall Risks No Fall Risks   Follow up Education provided;Falls prevention discussed  Falls prevention discussed Falls prevention discussed Falls prevention discussed    MEDICARE RISK AT HOME: Medicare Risk at Home Any stairs in or around the home?: No If so, are there any without handrails?: No Home free of loose throw rugs in walkways, pet beds, electrical cords, etc?: Yes Adequate lighting in your home to reduce risk of falls?: Yes Life alert?: No Use of a cane, walker or w/c?: No Grab bars in the bathroom?: Yes Shower chair or bench in shower?: No Elevated toilet seat or a handicapped toilet?: No  TIMED UP AND GO:  Was the test performed?  No    Cognitive Function:        02/07/2023    9:59 AM 08/13/2019   11:00 AM   6CIT Screen  What Year? 0 points 0 points  What month? 0 points 0 points  What time? 0 points 0 points  Count back from 20 0 points 0 points  Months in reverse 0 points 0 points  Repeat phrase 0 points 0 points  Total Score 0 points 0 points    Immunizations Immunization History  Administered Date(s) Administered   Fluad Quad(high Dose 65+) 03/01/2022   Influenza Inj Mdck Quad Pf 03/31/2018   Influenza, High Dose Seasonal PF 04/14/2021   Influenza,inj,Quad PF,6+ Mos 03/19/2019   Influenza-Unspecified 03/02/2020   PFIZER(Purple Top)SARS-COV-2 Vaccination 08/12/2019, 09/02/2019, 06/29/2020   PNEUMOCOCCAL CONJUGATE-20 03/01/2022   Pfizer Covid-19 Vaccine Bivalent Booster 51yrs & up 04/14/2021   Pneumococcal Conjugate-13 08/29/2020   Tdap 04/15/2017   Zoster Recombinant(Shingrix) 12/05/2017, 03/19/2019    TDAP status: Up to date  Flu Vaccine status: Up to date  Pneumococcal vaccine status: Up to date  Covid-19 vaccine status: Completed vaccines  Qualifies for Shingles Vaccine? Yes   Zostavax completed Yes   Shingrix Completed?: Yes  Screening Tests Health Maintenance  Topic Date Due   COVID-19 Vaccine (5 - 2023-24 season) 02/09/2022   INFLUENZA VACCINE  01/10/2023   MAMMOGRAM  08/01/2023   Medicare Annual Wellness (AWV)  02/07/2024   Colonoscopy  03/02/2024   DEXA SCAN  09/04/2024   DTaP/Tdap/Td (2 - Td or Tdap) 04/16/2027   Pneumonia Vaccine 38+ Years old  Completed   Hepatitis C Screening  Completed   Zoster Vaccines- Shingrix  Completed   HPV VACCINES  Aged Out    Health Maintenance  Health Maintenance Due  Topic Date Due   COVID-19 Vaccine (5 - 2023-24 season) 02/09/2022  INFLUENZA VACCINE  01/10/2023    Colorectal cancer screening: Type of screening: Colonoscopy. Completed yes. Repeat every 5-10 years  Mammogram status: Completed yes. Repeat every year  Bone Density status: Completed yes. Results reflect: Bone density results: NORMAL. Repeat every  5 years.  Lung Cancer Screening: (Low Dose CT Chest recommended if Age 64-80 years, 20 pack-year currently smoking OR have quit w/in 15years.) does not qualify.   Lung Cancer Screening Referral: no  Additional Screening:  Hepatitis C Screening: does not qualify; Completed yes  Vision Screening: Recommended annual ophthalmology exams for early detection of glaucoma and other disorders of the eye. Is the patient up to date with their annual eye exam?  Yes  Who is the provider or what is the name of the office in which the patient attends annual eye exams? Dr Cori Razor If pt is not established with a provider, would they like to be referred to a provider to establish care? No .   Dental Screening: Recommended annual dental exams for proper oral hygiene  Diabetic Foot Exam: n/a  Community Resource Referral / Chronic Care Management: CRR required this visit?  No   CCM required this visit?  Appt scheduled with PCP     Plan:     I have personally reviewed and noted the following in the patient's chart:   Medical and social history Use of alcohol, tobacco or illicit drugs  Current medications and supplements including opioid prescriptions. Patient is not currently taking opioid prescriptions. Functional ability and status Nutritional status Physical activity Advanced directives List of other physicians Hospitalizations, surgeries, and ER visits in previous 12 months Vitals Screenings to include cognitive, depression, and falls Referrals and appointments  In addition, I have reviewed and discussed with patient certain preventive protocols, quality metrics, and best practice recommendations. A written personalized care plan for preventive services as well as general preventive health recommendations were provided to patient.    Sue Lush, LPN   4/54/0981   After Visit Summary: (MyChart) Due to this being a telephonic visit, the after visit summary with patients  personalized plan was offered to patient via MyChart   Nurse Notes: Pt states she is doing good other than increased urinary incontinence. Pt indicates she has to wear a pad at night and in day some now. I made appt with PCP on Tuesday 02/12/23 for pt.

## 2023-02-08 NOTE — Progress Notes (Unsigned)
Name: Pamela Dixon   MRN: [id number]    DOB: 1955/08/03   Date:02/12/2023       Progress Note  Subjective  Chief Complaint  Urinary Incontinence   HPI  Dyslipidemia: she is now taking Atorvastatin 10 mg daily and denies myalgias. Last LDL still above 100 and she is willing to go up on dose to 20 mg, recheck labs next visit   Urinary incontinence: she has a long history of urinary urgency, but getting worse over the past 2 months, denies dysuria, hematuria, back pain or fever. She is ready to see urologist, we will also send a rx for her today, Having to wear pads when not at home and also at night    HTN: she was taking losartan hctz and toprol XL 25 mg, she was noticing increase in noticed increase in urinary frequency , we switched to Benicar 40 mg and continue metoprolol with increase dose of  50 mg dose,. BP has been around 130 ' s at home , today in our office 120/80    Vitamin D deficiency: continue supplementation, last level at goal    GERD: she has intermittent symptoms and takes Pepcid prn without side effects , she has noticed dysphagia to solids over the past 6 months, it is down on her esophagus, worse with meat but also with rice, getting progressively worse. 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/2019   Vitamin D deficiency 08/19/2019   Mixed hyperlipidemia 04/21/2019   Essential hypertension, benign 06/10/2018   Class 3 severe obesity with serious comorbidity and body mass index (BMI) of 40.0 to 44.9 in adult [facility]) 06/10/2018    Past Surgical History:  Procedure Laterality Date   ABDOMINAL HYSTERECTOMY  Early 90's   COLONOSCOPY WITH PROPOFOL N/A 03/15/2017   Procedure:  COLONOSCOPY WITH PROPOFOL;  Surgeon: Scot Jun, MD;  Location: [facility] ENDOSCOPY;  Service: Endoscopy;  Laterality: N/A;   TUBAL LIGATION  1985    Family History  Problem Relation Age of Onset   Diabetes Father    Cancer Father        colon skin cancer to head   Breast cancer Neg Hx     Social History   Tobacco Use   Smoking status: Never   Smokeless tobacco: Never  Substance Use Topics   Alcohol use: No     Current Outpatient Medications:    atorvastatin (LIPITOR) 10 MG tablet, Take 1 tablet (10 mg total) by mouth daily., Disp: 90 tablet, Rfl: 1   cholecalciferol (VITAMIN D3) 25 MCG (1000 UNIT) tablet, 1 each daily., Disp: , Rfl:    Coenzyme Q10-Vitamin E (QUNOL ULTRA COQ10 PO), , Disp: , Rfl:    metoprolol succinate (TOPROL-XL) 50 MG 24 hr tablet, Take 1 tablet (50 mg total) by mouth daily., Disp: 90 tablet, Rfl: 1   olmesartan (BENICAR) 40 MG tablet, Take 1 tablet (40 mg total) by mouth daily., Disp: 90 tablet, Rfl: 1   fluticasone (FLONASE) 50 MCG/ACT nasal spray, Place 2 sprays into both nostrils daily. (Patient not taking: Reported on 08/30/2022), Disp: 16 g, Rfl: 0  No Known Allergies  I personally reviewed active problem list, medication list, allergies, family history with the  patient/caregiver today.   ROS  Constitutional: Negative for fever or weight change.  Respiratory: Negative for cough and shortness of breath.   Cardiovascular: Negative for chest pain or palpitations.  Gastrointestinal: Negative for abdominal pain, no bowel changes.  Musculoskeletal: Negative for gait problem or joint swelling.  Skin: Negative for rash.  Neurological: Negative for dizziness or headache.  No other specific complaints in a complete review of systems (except as listed in HPI above).   Objective  Vitals:   02/12/23 1033  BP: 120/80  Pulse: 93  Resp: 18  Temp: 97.7 F (36.5 C)  TempSrc: Oral  SpO2: 95%  Weight: 262 lb 4.8 oz (119 kg)  Height: 5\' 6"  (1.676 m)     Body mass index is 42.34 kg/m.  Physical Exam  Constitutional: Patient appears well-developed and well-nourished. Obese  No distress.  HEENT: head atraumatic, normocephalic, pupils equal and reactive to light,, neck supple Cardiovascular: Normal rate, regular rhythm and normal heart sounds.  No murmur heard. No BLE edema. Pulmonary/Chest: Effort normal and breath sounds normal. No respiratory distress. Abdominal: Soft.  There is no tenderness. Psychiatric: Patient has a normal mood and affect. behavior is normal. Judgment and thought content normal.    PHQ2/9:    02/12/2023   10:35 AM 02/07/2023    9:57 AM 08/30/2022   11:16 AM 03/29/2022    9:45 AM 03/01/2022    8:53 AM  Depression screen PHQ 2/9  Decreased Interest 0 0 0 0 0  Down, Depressed, Hopeless 0 0 0 0 0  PHQ - 2 Score 0 0 0 0 0  Altered sleeping 0  0 0 0  Tired, decreased energy 0  0 0 0  Change in appetite 0  0 0 0  Feeling bad or failure about yourself  0  0 0 0  Trouble concentrating 0  0 0 0  Moving slowly or fidgety/restless 0  0 0 0  Suicidal thoughts 0  0 0 0  PHQ-9 Score 0  0 0 0    phq 9 is negative   Functional Status Survey: Is the patient deaf or have difficulty hearing?: No Does the patient have difficulty seeing, even when wearing glasses/contacts?: No Does the patient have difficulty concentrating, remembering, or making decisions?: No Does the patient have difficulty walking or climbing stairs?: No Does the patient have difficulty dressing or bathing?: No Does the patient have difficulty doing errands alone such as visiting a doctor's office or shopping?: No    Assessment & Plan   1. Essential hypertension, benign  - metoprolol succinate (TOPROL-XL) 50 MG 24 hr tablet; Take 1 tablet (50 mg total) by mouth daily.  Dispense: 90 tablet; Refill: 1 - olmesartan (BENICAR) 40 MG tablet; Take 1 tablet (40 mg total) by mouth daily.  Dispense: 90 tablet; Refill: 1  2. Gastroesophageal reflux  disease, unspecified whether esophagitis present  Denies heartburn but has noticed dysphagia   3. Mixed hyperlipidemia  We will adjust dose  - atorvastatin (LIPITOR) 20 MG tablet; Take 1 tablet (20 mg total) by mouth daily.  Dispense: 90 tablet; Refill: 1  4. Class 3 severe obesity due to excess calories with serious comorbidity and body mass index (BMI) of 40.0 to 44.9 in adult [facility])  Discussed with the patient the risk posed by an increased BMI. Discussed importance of portion control, calorie counting and at least 150 minutes of physical activity weekly. Avoid sweet beverages and drink more water. Eat at least 6 servings of  fruit and vegetables daily    5. Urgency incontinence  - solifenacin (VESICARE) 10 MG tablet; Take 1 tablet (10 mg total) by mouth daily.  Dispense: 90 tablet; Refill: 0 - Ambulatory referral to Urology  6. Vitamin D deficiency  Continue supplementation   7. Esophageal dysphagia  - Ambulatory referral to Gastroenterology

## 2023-02-12 ENCOUNTER — Encounter: Payer: Self-pay | Admitting: Family Medicine

## 2023-02-12 ENCOUNTER — Ambulatory Visit (INDEPENDENT_AMBULATORY_CARE_PROVIDER_SITE_OTHER): Payer: Medicare HMO | Admitting: Family Medicine

## 2023-02-12 VITALS — BP 120/80 | HR 93 | Temp 97.7°F | Resp 18 | Ht 66.0 in | Wt 262.3 lb

## 2023-02-12 DIAGNOSIS — E782 Mixed hyperlipidemia: Secondary | ICD-10-CM

## 2023-02-12 DIAGNOSIS — I10 Essential (primary) hypertension: Secondary | ICD-10-CM | POA: Diagnosis not present

## 2023-02-12 DIAGNOSIS — R1319 Other dysphagia: Secondary | ICD-10-CM

## 2023-02-12 DIAGNOSIS — E559 Vitamin D deficiency, unspecified: Secondary | ICD-10-CM

## 2023-02-12 DIAGNOSIS — K219 Gastro-esophageal reflux disease without esophagitis: Secondary | ICD-10-CM

## 2023-02-12 DIAGNOSIS — N3941 Urge incontinence: Secondary | ICD-10-CM

## 2023-02-12 DIAGNOSIS — Z6841 Body Mass Index (BMI) 40.0 and over, adult: Secondary | ICD-10-CM

## 2023-02-12 DIAGNOSIS — E66813 Obesity, class 3: Secondary | ICD-10-CM

## 2023-02-12 MED ORDER — METOPROLOL SUCCINATE ER 50 MG PO TB24
50.0000 mg | ORAL_TABLET | Freq: Every day | ORAL | 1 refills | Status: DC
Start: 2023-02-12 — End: 2023-09-04

## 2023-02-12 MED ORDER — OLMESARTAN MEDOXOMIL 40 MG PO TABS: 40.0000 mg | ORAL_TABLET | Freq: Every day | ORAL | 1 refills | Status: DC

## 2023-02-12 MED ORDER — ATORVASTATIN CALCIUM 20 MG PO TABS
20.0000 mg | ORAL_TABLET | Freq: Every day | ORAL | 1 refills | Status: DC
Start: 2023-02-12 — End: 2023-09-04

## 2023-02-12 MED ORDER — SOLIFENACIN SUCCINATE 10 MG PO TABS
10.0000 mg | ORAL_TABLET | Freq: Every day | ORAL | 0 refills | Status: DC
Start: 2023-02-12 — End: 2023-04-15

## 2023-03-04 ENCOUNTER — Other Ambulatory Visit: Payer: Self-pay | Admitting: Family Medicine

## 2023-03-04 DIAGNOSIS — E782 Mixed hyperlipidemia: Secondary | ICD-10-CM

## 2023-04-10 ENCOUNTER — Ambulatory Visit (INDEPENDENT_AMBULATORY_CARE_PROVIDER_SITE_OTHER): Payer: Medicare HMO

## 2023-04-10 DIAGNOSIS — Z23 Encounter for immunization: Secondary | ICD-10-CM | POA: Diagnosis not present

## 2023-04-12 DIAGNOSIS — E785 Hyperlipidemia, unspecified: Secondary | ICD-10-CM | POA: Insufficient documentation

## 2023-04-15 ENCOUNTER — Encounter: Payer: Self-pay | Admitting: Urology

## 2023-04-15 ENCOUNTER — Ambulatory Visit: Payer: Medicare HMO | Admitting: Urology

## 2023-04-15 VITALS — BP 140/91 | HR 93 | Ht 67.0 in | Wt 260.0 lb

## 2023-04-15 DIAGNOSIS — N3941 Urge incontinence: Secondary | ICD-10-CM

## 2023-04-15 LAB — URINALYSIS, COMPLETE
Bilirubin, UA: NEGATIVE
Glucose, UA: NEGATIVE
Ketones, UA: NEGATIVE
Nitrite, UA: POSITIVE — AB
Protein,UA: NEGATIVE
Specific Gravity, UA: 1.025 (ref 1.005–1.030)
Urobilinogen, Ur: 0.2 mg/dL (ref 0.2–1.0)
pH, UA: 5.5 (ref 5.0–7.5)

## 2023-04-15 LAB — MICROSCOPIC EXAMINATION: Epithelial Cells (non renal): >10 /[HPF] — AB (ref 0–10)

## 2023-04-15 MED ORDER — GEMTESA 75 MG PO TABS: 1.0000 | ORAL_TABLET | Freq: Every day | ORAL | Status: DC

## 2023-04-15 NOTE — Progress Notes (Signed)
04/15/2023 1:06 PM   Pamela Dixon [birth year] [id number]  Referring provider: Alba Cory, MD [address] [place],  [place] [address]  Chief Complaint  Patient presents with   Establish Care   Urinary Incontinence    HPI: I was consulted to assess the patient's urinary incontinence.  She was having urge incontinence wearing 3 pads a day damp to significantly wet.  She was having moderate to severe bedwetting.  No stress incontinence.  She was put on Vesicare and now wears 1 pad a day when she goes out in public.  She still has enuresis but it is milder in severity  She was voiding every hour prior to treatment but every 3 hours now.  She is get up once at night.  Flow was moderate.  She has had a hysterectomy  No history of kidney stones bladder surgery or bladder infections.  No neurologic issues   PMH: Past Medical History:  Diagnosis Date   Gastroesophageal reflux disease 03/01/2022   High cholesterol    Hypertension    Mixed hyperlipidemia     Surgical History: Past Surgical History:  Procedure Laterality Date   ABDOMINAL HYSTERECTOMY  Early 90's   COLONOSCOPY WITH PROPOFOL N/A 03/15/2017   Procedure: COLONOSCOPY WITH PROPOFOL;  Surgeon: Scot Jun, MD;  Location: [facility] ENDOSCOPY;  Service: Endoscopy;  Laterality: N/A;   TUBAL LIGATION  1985    Home Medications:  Allergies as of 04/15/2023   No Known Allergies      Medication List        Accurate as of April 15, 2023  1:06 PM. If you have any questions, ask your nurse or doctor.          atorvastatin 20 MG tablet Commonly known as: LIPITOR Take 1 tablet (20 mg total) by mouth daily.   cholecalciferol 25 MCG (1000 UNIT) tablet Commonly known as: VITAMIN D3 1 each daily.   fluticasone 50 MCG/ACT nasal spray Commonly known as: FLONASE Place 2 sprays into both nostrils daily.   metoprolol succinate 50 MG 24 hr tablet Commonly known as: TOPROL-XL Take 1 tablet (50 mg  total) by mouth daily.   olmesartan 40 MG tablet Commonly known as: Benicar Take 1 tablet (40 mg total) by mouth daily.   omeprazole 20 MG capsule Commonly known as: PRILOSEC Take by mouth.   QUNOL ULTRA COQ10 PO   solifenacin 10 MG tablet Commonly known as: VESICARE Take 1 tablet (10 mg total) by mouth daily.        Allergies: No Known Allergies  Family History: Family History  Problem Relation Age of Onset   Diabetes Father    Cancer Father        colon skin cancer to head   Breast cancer Neg Hx     Social History:  reports that she has never smoked. She has never used smokeless tobacco. She reports that she does not drink alcohol and does not use drugs.  ROS:                                        Physical Exam: BP (!) 140/91   Pulse 93   Ht 5\' 7"  (1.702 m)   Wt 117.9 kg   BMI 40.72 kg/m   Constitutional:  Alert and oriented, No acute distress. HEENT: [place] AT, moist mucus membranes.  Trachea midline, no masses. Cardiovascular: No  clubbing, cyanosis, or edema. Respiratory: Normal respiratory effort, no increased work of breathing. GI: Abdomen is soft, nontender, nondistended, no abdominal masses GU: Patient had grade 2 hypermobility bladder neck and negative cough test.  She had a small grade 2 cystocele.  A little bit limited since she was not down near the end of the table.  It was asymptomatic Skin: No rashes, bruises or suspicious lesions. Lymph: No cervical or inguinal adenopathy. Neurologic: Grossly intact, no focal deficits, moving all 4 extremities. Psychiatric: Normal mood and affect.  Laboratory Data: Lab Results  Component Value Date   WBC 6.6 08/30/2022   HGB 15.1 08/30/2022   HCT 45.5 (H) 08/30/2022   MCV 92.3 08/30/2022   PLT 282 08/30/2022    Lab Results  Component Value Date   CREATININE 0.68 08/30/2022    No results found for: "PSA"  No results found for: "TESTOSTERONE"  Lab Results  Component Value  Date   HGBA1C 5.6 08/30/2022    Urinalysis No results found for: "COLORURINE", "APPEARANCEUR", "LABSPEC", "PHURINE", "GLUCOSEU", "HGBUR", "BILIRUBINUR", "KETONESUR", "PROTEINUR", "UROBILINOGEN", "NITRITE", "LEUKOCYTESUR"  Pertinent Imaging: Urine reviewed.  Chart reviewed.  Assessment & Plan: Patient has urge incontinence and bedwetting.  She has had a significant improvement in incontinence bedwetting and frequency on Vesicare.  Patient says she is at least 50% better but still has bedwetting and some urge incontinence.  I I decided to put her on Gemtesa as monotherapy versus combination have her come back in 6 weeks for repeat urine, urine culture and cystoscopy.  I went over urodynamics in [place] in full detail she understands we may needed in the future.  I also mentions that sometimes will use combination treatment.  She had a urinalysis that was positive for blood and bacteria but there was not enough to send for culture.  Clinically not infected.  I will check urinalysis next visit.    1. Urgency incontinence  - Urinalysis, Complete   No follow-ups on file.  Martina Sinner, MD  [facility] [address] [place], [place] [address] [phone]

## 2023-04-15 NOTE — Patient Instructions (Signed)

## 2023-04-18 ENCOUNTER — Other Ambulatory Visit: Payer: Self-pay | Admitting: Nurse Practitioner

## 2023-04-18 DIAGNOSIS — R1310 Dysphagia, unspecified: Secondary | ICD-10-CM

## 2023-04-23 ENCOUNTER — Ambulatory Visit
Admission: RE | Admit: 2023-04-23 | Discharge: 2023-04-23 | Disposition: A | Discharge status: Home / Self Care | Payer: Medicare HMO | Source: Ambulatory Visit | Attending: Nurse Practitioner | Admitting: Nurse Practitioner

## 2023-04-23 DIAGNOSIS — R1310 Dysphagia, unspecified: Secondary | ICD-10-CM | POA: Diagnosis present

## 2023-05-03 ENCOUNTER — Ambulatory Visit
Admission: RE | Admit: 2023-05-03 | Discharge: 2023-05-03 | Disposition: A | Discharge status: Home / Self Care | Payer: Medicare HMO | Attending: Gastroenterology | Admitting: Gastroenterology

## 2023-05-03 ENCOUNTER — Ambulatory Visit: Payer: Medicare HMO | Admitting: Anesthesiology

## 2023-05-03 ENCOUNTER — Encounter
Admission: RE | Disposition: A | Discharge status: Home / Self Care | Payer: Self-pay | Source: Home / Self Care | Attending: Gastroenterology

## 2023-05-03 DIAGNOSIS — K449 Diaphragmatic hernia without obstruction or gangrene: Secondary | ICD-10-CM | POA: Diagnosis not present

## 2023-05-03 DIAGNOSIS — Z6841 Body Mass Index (BMI) 40.0 and over, adult: Secondary | ICD-10-CM | POA: Diagnosis not present

## 2023-05-03 DIAGNOSIS — R1310 Dysphagia, unspecified: Secondary | ICD-10-CM | POA: Insufficient documentation

## 2023-05-03 DIAGNOSIS — K2100 Gastro-esophageal reflux disease with esophagitis, without bleeding: Secondary | ICD-10-CM | POA: Diagnosis not present

## 2023-05-03 DIAGNOSIS — I10 Essential (primary) hypertension: Secondary | ICD-10-CM | POA: Insufficient documentation

## 2023-05-03 DIAGNOSIS — K222 Esophageal obstruction: Secondary | ICD-10-CM | POA: Insufficient documentation

## 2023-05-03 DIAGNOSIS — E66813 Obesity, class 3: Secondary | ICD-10-CM | POA: Diagnosis not present

## 2023-05-03 DIAGNOSIS — E782 Mixed hyperlipidemia: Secondary | ICD-10-CM | POA: Diagnosis not present

## 2023-05-03 HISTORY — PX: ESOPHAGOGASTRODUODENOSCOPY (EGD) WITH PROPOFOL: SHX5813

## 2023-05-03 SURGERY — ESOPHAGOGASTRODUODENOSCOPY (EGD) WITH PROPOFOL
Anesthesia: General

## 2023-05-03 MED ORDER — PROPOFOL 500 MG/50ML IV EMUL
INTRAVENOUS | Status: DC | PRN
  Administered 2023-05-03: 150 ug/kg/min via INTRAVENOUS

## 2023-05-03 MED ORDER — LIDOCAINE HCL (CARDIAC) PF 100 MG/5ML IV SOSY
PREFILLED_SYRINGE | INTRAVENOUS | Status: DC | PRN
  Administered 2023-05-03: 60 mg via INTRAVENOUS

## 2023-05-03 MED ORDER — DEXMEDETOMIDINE HCL IN NACL 80 MCG/20ML IV SOLN
INTRAVENOUS | Status: DC | PRN
Start: 2023-05-03 — End: 2023-05-03
  Administered 2023-05-03: 8 ug via INTRAVENOUS

## 2023-05-03 MED ORDER — PROPOFOL 10 MG/ML IV BOLUS
INTRAVENOUS | Status: DC | PRN
  Administered 2023-05-03 (×2): 50 mg via INTRAVENOUS
  Administered 2023-05-03: 100 mg via INTRAVENOUS

## 2023-05-03 MED ORDER — SODIUM CHLORIDE 0.9 % IV SOLN
INTRAVENOUS | Status: DC
Start: 2023-05-03 — End: 2023-05-03
  Administered 2023-05-03: 20 mL/h via INTRAVENOUS

## 2023-05-03 MED ORDER — PROPOFOL 10 MG/ML IV BOLUS
INTRAVENOUS | Status: AC
  Filled 2023-05-03: qty 40

## 2023-05-03 NOTE — Anesthesia Preprocedure Evaluation (Signed)
Anesthesia Evaluation  Patient identified by MRN, date of birth, ID band Patient awake    Reviewed: Allergy & Precautions, NPO status , Patient's Chart, lab work & pertinent test results  Airway Mallampati: III  TM Distance: >3 FB Neck ROM: full    Dental  (+) Teeth Intact   Pulmonary neg pulmonary ROS   Pulmonary exam normal breath sounds clear to auscultation       Cardiovascular Exercise Tolerance: Good hypertension, Pt. on medications negative cardio ROS Normal cardiovascular exam Rhythm:Regular Rate:Normal     Neuro/Psych negative neurological ROS  negative psych ROS   GI/Hepatic negative GI ROS, Neg liver ROS,GERD  Medicated,,  Endo/Other  negative endocrine ROS  Class 3 obesity  Renal/GU negative Renal ROS  negative genitourinary   Musculoskeletal negative musculoskeletal ROS (+)    Abdominal  (+) + obese  Peds negative pediatric ROS (+)  Hematology negative hematology ROS (+)   Anesthesia Other Findings Past Medical History: 03/01/2022: Gastroesophageal reflux disease No date: High cholesterol No date: Hypertension No date: Mixed hyperlipidemia  Past Surgical History: Early 90's: ABDOMINAL HYSTERECTOMY 03/15/2017: COLONOSCOPY WITH PROPOFOL; N/A     Comment:  Procedure: COLONOSCOPY WITH PROPOFOL;  Surgeon: Scot Jun, MD;  Location: [facility] ENDOSCOPY;  Service:               Endoscopy;  Laterality: N/A; 1985: TUBAL LIGATION  BMI    Body Mass Index: 42.42 kg/m      Reproductive/Obstetrics negative OB ROS                             Anesthesia Physical Anesthesia Plan  ASA: 2  Anesthesia Plan: General   Post-op Pain Management:    Induction: Intravenous  PONV Risk Score and Plan: Propofol infusion and TIVA  Airway Management Planned:   Additional Equipment:   Intra-op Plan:   Post-operative Plan:   Informed Consent: I have reviewed the  patients History and Physical, chart, labs and discussed the procedure including the risks, benefits and alternatives for the proposed anesthesia with the patient or authorized representative who has indicated his/her understanding and acceptance.     Dental Advisory Given  Plan Discussed with: CRNA and Surgeon  Anesthesia Plan Comments:        Anesthesia Quick Evaluation

## 2023-05-03 NOTE — H&P (Signed)
Outpatient short stay form Pre-procedure 05/03/2023  Regis Bill, MD  Primary Physician: Alba Cory, MD  Reason for visit:  Dysphagia  History of present illness:    67 y/o lady with history of hypertension, HLD, and obesity here for EGD for solid food dysphagia. She notes her symptoms have resolved and a barium swallow was unremarkable. No blood thinners. No neck surgeries. No family history of GI malignancies.    Current Facility-Administered Medications:    0.9 %  sodium chloride infusion, , Intravenous, Continuous, Larone Kliethermes, Rossie Muskrat, MD, Last Rate: 20 mL/hr at 05/03/23 0956, 20 mL/hr at 05/03/23 0956  Medications Prior to Admission  Medication Sig Dispense Refill Last Dose   atorvastatin (LIPITOR) 20 MG tablet Take 1 tablet (20 mg total) by mouth daily. 90 tablet 1 05/02/2023   cholecalciferol (VITAMIN D3) 25 MCG (1000 UNIT) tablet 1 each daily.   05/02/2023   Coenzyme Q10-Vitamin E (QUNOL ULTRA COQ10 PO)    05/02/2023   fluticasone (FLONASE) 50 MCG/ACT nasal spray Place 2 sprays into both nostrils daily. 16 g 0 05/02/2023   metoprolol succinate (TOPROL-XL) 50 MG 24 hr tablet Take 1 tablet (50 mg total) by mouth daily. 90 tablet 1 05/03/2023 at 0600   olmesartan (BENICAR) 40 MG tablet Take 1 tablet (40 mg total) by mouth daily. 90 tablet 1 05/03/2023 at 0600   omeprazole (PRILOSEC) 20 MG capsule Take by mouth.   05/02/2023   Vibegron (GEMTESA) 75 MG TABS Take 1 tablet (75 mg total) by mouth daily. (Patient not taking: Reported on 05/03/2023)   Completed Course     No Known Allergies   Past Medical History:  Diagnosis Date   Gastroesophageal reflux disease 03/01/2022   High cholesterol    Hypertension    Mixed hyperlipidemia     Review of systems:  Otherwise negative.    Physical Exam  Gen: Alert, oriented. Appears stated age.  HEENT: PERRLA. Lungs: No respiratory distress CV: RRR Abd: soft, benign, no masses Ext: No edema    Planned procedures:  Proceed with EGD. The patient understands the nature of the planned procedure, indications, risks, alternatives and potential complications including but not limited to bleeding, infection, perforation, damage to internal organs and possible oversedation/side effects from anesthesia. The patient agrees and gives consent to proceed.  Please refer to procedure notes for findings, recommendations and patient disposition/instructions.     Regis Bill, MD [facility]

## 2023-05-03 NOTE — Transfer of Care (Signed)
Immediate Anesthesia Transfer of Care Note  Patient: Pamela Dixon  Procedure(s) Performed: ESOPHAGOGASTRODUODENOSCOPY (EGD) WITH PROPOFOL Balloon dilation wire-guided  Patient Location: PACU  Anesthesia Type:MAC  Level of Consciousness: drowsy  Airway & Oxygen Therapy: Patient Spontanous Breathing  Post-op Assessment: Report given to RN and Post -op Vital signs reviewed and stable  Post vital signs: Reviewed and stable  Last Vitals:  Vitals Value Taken Time  BP    Temp    Pulse 82 05/03/23 1107  Resp 30 05/03/23 1107  SpO2 91 % 05/03/23 1107  Vitals shown include unfiled device data.  Last Pain:  Vitals:   05/03/23 0947  TempSrc: Temporal  PainSc: 0-No pain         Complications: No notable events documented.

## 2023-05-03 NOTE — Op Note (Signed)
[facility]astroenter[facility] Patient Name: Pamela Dixon Procedure Date: 05/03/2023 10:37 AM MRN: [id number] Account #: [id number] Date of Birth: 08-Dec-1955 Admit Type: Ambulatory Age: 67 Room: [facility] ENDO ROOM 3 Gender: Female Note Status: Finalized Instrument Name: Upper Endoscope [id number] Procedure:             Upper GI endoscopy Indications:           Dysphagia Providers:             Eather Colas MD, MD Referring MD:          Onnie Boer. Sowles, MD (Referring MD) Medicines:             Monitored Anesthesia Care Complications:         No immediate complications. Estimated blood loss:                         Minimal. Procedure:             Pre-Anesthesia Assessment:                        - Prior to the procedure, a History and Physical was                         performed, and patient medications and allergies were                         reviewed. The patient is competent. The risks and                         benefits of the procedure and the sedation options and                         risks were discussed with the patient. All questions                         were answered and informed consent was obtained.                         Patient identification and proposed procedure were                         verified by the physician, the nurse, the                         anesthesiologist, the anesthetist and the technician                         in the endoscopy suite. Mental Status Examination:                         alert and oriented. Airway Examination: normal                         oropharyngeal airway and neck mobility. Respiratory                         Examination: clear to auscultation. CV Examination:  normal. Prophylactic Antibiotics: The patient does not                         require prophylactic antibiotics. Prior                         Anticoagulants: The patient has taken no anticoagulant                          or antiplatelet agents. ASA Grade Assessment: II - A                         patient with mild systemic disease. After reviewing                         the risks and benefits, the patient was deemed in                         satisfactory condition to undergo the procedure. The                         anesthesia plan was to use monitored anesthesia care                         (MAC). Immediately prior to administration of                         medications, the patient was re-assessed for adequacy                         to receive sedatives. The heart rate, respiratory                         rate, oxygen saturations, blood pressure, adequacy of                         pulmonary ventilation, and response to care were                         monitored throughout the procedure. The physical                         status of the patient was re-assessed after the                         procedure.                        After obtaining informed consent, the endoscope was                         passed under direct vision. Throughout the procedure,                         the patient's blood pressure, pulse, and oxygen                         saturations were monitored continuously. The  Endosonoscope was introduced through the mouth, and                         advanced to the second part of duodenum. The upper GI                         endoscopy was accomplished without difficulty. The                         patient tolerated the procedure well. Findings:      A non-obstructing Schatzki ring was found in the lower third of the       esophagus. A TTS dilator was passed through the scope. Dilation with a       15-16.5-18 mm balloon dilator was performed to 18 mm. The dilation site       was examined and showed mild mucosal disruption. Estimated blood loss       was minimal.      LA Grade A (one or more mucosal breaks less than 5 mm, not extending       between tops  of 2 mucosal folds) esophagitis with no bleeding was found.      A small hiatal hernia was present.      The entire examined stomach was normal.      The examined duodenum was normal. Impression:            - Non-obstructing Schatzki ring. Dilated.                        - LA Grade A reflux esophagitis with no bleeding.                        - Small hiatal hernia.                        - Normal stomach.                        - Normal examined duodenum.                        - No specimens collected. Recommendation:        - Discharge patient to home.                        - Resume previous diet.                        - Continue present medications.                        - Return to referring physician as previously                         scheduled.                        - Continue taking omeprazole which was recently                         prescribed. Procedure Code(s):     --- Professional ---  [phone], Esophagogastroduodenoscopy, flexible,                         transoral; with transendoscopic balloon dilation of                         esophagus (less than 30 mm diameter) Diagnosis Code(s):     --- Professional ---                        K22.2, Esophageal obstruction                        K21.00, Gastro-esophageal reflux disease with                         esophagitis, without bleeding                        K44.9, Diaphragmatic hernia without obstruction or                         gangrene                        R13.10, Dysphagia, unspecified CPT copyright 2022 American Medical Association. All rights reserved. The codes documented in this report are preliminary and upon coder review may  be revised to meet current compliance requirements. Eather Colas MD, MD 05/03/2023 11:07:41 AM Number of Addenda: 0 Note Initiated On: 05/03/2023 10:37 AM Estimated Blood Loss:  Estimated blood loss was minimal.      [facility]

## 2023-05-03 NOTE — Interval H&P Note (Signed)
History and Physical Interval Note:  05/03/2023 10:49 AM  Pamela Dixon  has presented today for surgery, with the diagnosis of Dysphagia.  The various methods of treatment have been discussed with the patient and family. After consideration of risks, benefits and other options for treatment, the patient has consented to  Procedure(s): ESOPHAGOGASTRODUODENOSCOPY (EGD) WITH PROPOFOL (N/A) as a surgical intervention.  The patient's history has been reviewed, patient examined, no change in status, stable for surgery.  I have reviewed the patient's chart and labs.  Questions were answered to the patient's satisfaction.     Regis Bill  Ok to proceed with EGD

## 2023-05-07 ENCOUNTER — Encounter: Payer: Self-pay | Admitting: Gastroenterology

## 2023-05-08 NOTE — Anesthesia Postprocedure Evaluation (Signed)
Anesthesia Post Note  Patient: Pamela Dixon  Procedure(s) Performed: ESOPHAGOGASTRODUODENOSCOPY (EGD) WITH PROPOFOL Balloon dilation wire-guided  Patient location during evaluation: PACU Anesthesia Type: General Level of consciousness: awake and awake and alert Pain management: pain level controlled Vital Signs Assessment: post-procedure vital signs reviewed and stable Respiratory status: spontaneous breathing Cardiovascular status: stable Anesthetic complications: no   No notable events documented.   Last Vitals:  Vitals:   05/03/23 1107 05/03/23 1117  BP: (!) 100/59 113/66  Pulse:    Resp:    Temp: (!) 36.4 C   SpO2:      Last Pain:  Vitals:   05/05/23 1013  TempSrc:   PainSc: 0-No pain                 VAN STAVEREN,Gearldean Lomanto

## 2023-05-22 ENCOUNTER — Other Ambulatory Visit: Payer: Self-pay | Admitting: Family Medicine

## 2023-05-22 DIAGNOSIS — N3941 Urge incontinence: Secondary | ICD-10-CM

## 2023-06-24 ENCOUNTER — Ambulatory Visit: Payer: Medicare HMO | Admitting: Urology

## 2023-06-24 VITALS — BP 118/77 | HR 120 | Ht 65.0 in | Wt 264.2 lb

## 2023-06-24 DIAGNOSIS — N3941 Urge incontinence: Secondary | ICD-10-CM | POA: Diagnosis not present

## 2023-06-24 LAB — URINALYSIS, COMPLETE
Bilirubin, UA: NEGATIVE
Glucose, UA: NEGATIVE
Ketones, UA: NEGATIVE
Nitrite, UA: POSITIVE — AB
Protein,UA: NEGATIVE
Specific Gravity, UA: 1.015 (ref 1.005–1.030)
Urobilinogen, Ur: 0.2 mg/dL (ref 0.2–1.0)
pH, UA: 5.5 (ref 5.0–7.5)

## 2023-06-24 LAB — MICROSCOPIC EXAMINATION

## 2023-06-24 MED ORDER — GEMTESA 75 MG PO TABS: 1.0000 | ORAL_TABLET | Freq: Every day | ORAL | 11 refills | Status: DC

## 2023-06-24 NOTE — Progress Notes (Signed)
 06/24/2023 1:58 PM   Pamela Dixon 1955-11-22 [id number]  Referring provider: Sowles, Krichna, MD [address] [place],  [place] [address]  Chief Complaint  Patient presents with   Cysto    HPI: I was consulted to assess the patient's urinary incontinence.  She was having urge incontinence wearing 3 pads a day damp to significantly wet.  She was having moderate to severe bedwetting.  No stress incontinence.  She was put on Vesicare  and now wears 1 pad a day when she goes out in public.  She still has enuresis but it is milder in severity   She was voiding every hour prior to treatment but every 3 hours now.  She is get up once at night.  Flow was moderate.   She has had a hysterectomy    Patient had grade 2 hypermobility bladder neck and negative cough test.  She had a small grade 2 cystocele.  A little bit limited since she was not down near the end of the table.  It was asymptomatic   Patient has urge incontinence and bedwetting.  She has had a significant improvement in incontinence bedwetting and frequency on Vesicare .  Patient says she is at least 50% better but still has bedwetting and some urge incontinence.  I I decided to put her on Gemtesa  as monotherapy versus combination have her come back in 6 weeks for repeat urine, urine culture and cystoscopy.  I went over urodynamics in [place] in full detail she understands we may needed in the future.  I also mentions that sometimes will use combination treatment.   She had a urinalysis that was positive for blood and bacteria but there was not enough to send for culture.  Clinically not infected.  I will check urinalysis next visit.  Today Patient says she is dramatically improved with a more than 80% improvement.  Much less urge incontinence and  frequency.  Clinically not infected.  Said that she still has the bedwetting but is less severe  On pelvic examination she had some bladder neck descensus at rest.  She had  a grade 1 cystocele with a little bit of rotational descent and almost this small grade 2 cystocele that was otherwise asymptomatic.  I could not perform cystoscopy.  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  I did not dilator.   PMH: Past Medical History:  Diagnosis Date   Gastroesophageal reflux disease 03/01/2022   High cholesterol    Hypertension    Mixed hyperlipidemia     Surgical History: Past Surgical History:  Procedure Laterality Date   ABDOMINAL HYSTERECTOMY  Early 90's   COLONOSCOPY WITH PROPOFOL  N/A 03/15/2017   Procedure: COLONOSCOPY WITH PROPOFOL ;  Surgeon: Viktoria Lamar DASEN, MD;  Location: [facility] ENDOSCOPY;  Service: Endoscopy;  Laterality: N/A;   ESOPHAGOGASTRODUODENOSCOPY (EGD) WITH PROPOFOL  N/A 05/03/2023   Procedure: ESOPHAGOGASTRODUODENOSCOPY (EGD) WITH PROPOFOL ;  Surgeon: Maryruth Ole DASEN, MD;  Location: [facility] ENDOSCOPY;  Service: Endoscopy;  Laterality: N/A;   TUBAL LIGATION  1985    Home Medications:  Allergies as of 06/24/2023   No Known Allergies      Medication List        Accurate as of June 24, 2023  1:58 PM. If you have any questions, ask your nurse or doctor.          atorvastatin  20 MG tablet Commonly known as: LIPITOR Take 1 tablet (20 mg total) by mouth  daily.   cholecalciferol 25 MCG (1000 UNIT) tablet Commonly known as: VITAMIN D3 1 each daily.   fluticasone  50 MCG/ACT nasal spray Commonly known as: FLONASE  Place 2 sprays into both nostrils daily.   Gemtesa  75 MG Tabs Generic drug: Vibegron  Take 1 tablet (75 mg total) by mouth daily.   metoprolol  succinate 50 MG 24 hr tablet Commonly known as: TOPROL -XL Take 1 tablet (50 mg total) by mouth daily.   olmesartan  40 MG tablet Commonly known as: Benicar  Take 1 tablet (40 mg total) by mouth daily.   omeprazole 20 MG capsule Commonly known as: PRILOSEC Take by mouth.   QUNOL ULTRA COQ10 PO        Allergies: No Known  Allergies  Family History: Family History  Problem Relation Age of Onset   Diabetes Father    Cancer Father        colon skin cancer to head   Breast cancer Neg Hx     Social History:  reports that she has never smoked. She has never used smokeless tobacco. She reports that she does not drink alcohol and does not use drugs.  ROS:                                        Physical Exam: There were no vitals taken for this visit.  Constitutional:  Alert and oriented, No acute distress. HEENT:  AT, moist mucus membranes.  Trachea midline, no masses.  Laboratory Data: Lab Results  Component Value Date   WBC 6.6 08/30/2022   HGB 15.1 08/30/2022   HCT 45.5 (H) 08/30/2022   MCV 92.3 08/30/2022   PLT 282 08/30/2022    Lab Results  Component Value Date   CREATININE 0.68 08/30/2022    No results found for: PSA  No results found for: TESTOSTERONE  Lab Results  Component Value Date   HGBA1C 5.6 08/30/2022    Urinalysis    Component Value Date/Time   APPEARANCEUR Hazy (A) 04/15/2023 1301   GLUCOSEU Negative 04/15/2023 1301   BILIRUBINUR Negative 04/15/2023 1301   PROTEINUR Negative 04/15/2023 1301   NITRITE Positive (A) 04/15/2023 1301   LEUKOCYTESUR Trace (A) 04/15/2023 1301    Pertinent Imaging: Negative with no blood in urine.  Urine sent for culture  Assessment & Plan: Clinically patient has urge incontinence and bedwetting.  No blood in urine.  Picture drawn regarding meatal stenosis.  Picture drawn regarding mild prolapse unrelated.  Patient will come back on Vesicare  in combination with Gemtesa  samples and prescription.  If she still leaking I did discuss urodynamics and we will order them.  I do not plan on doing urethral dilation.  We will proceed accordingly.  It appears she has ankle edema and likely has a nocturnal diuresis she was also described  1. Urgency incontinence (Primary)  - Urinalysis, Complete   No follow-ups on  file.  [name], MD  [facility] [address] [place], [place] [address] [phone]

## 2023-06-26 LAB — CULTURE, URINE COMPREHENSIVE

## 2023-06-28 ENCOUNTER — Telehealth: Payer: Self-pay | Admitting: *Deleted

## 2023-06-28 MED ORDER — NITROFURANTOIN MACROCRYSTAL 100 MG PO CAPS: 100.0000 mg | ORAL_CAPSULE | Freq: Two times a day (BID) | ORAL | 0 refills | Status: AC

## 2023-06-28 NOTE — Telephone Encounter (Signed)
-----   Message from [place] A Macdiarmid sent at 06/28/2023 11:38 AM EST ----- Macrodantin 100 mg twice a day for 7 days ----- Message ----- From: Interface, [facility] Lab Results In Sent: 06/24/2023   4:36 PM EST To: Alfredo Martinez, MD

## 2023-06-28 NOTE — Telephone Encounter (Signed)
Left message on VM that rx was sent to pharmacy, advised pt to call back with any questions

## 2023-08-11 ENCOUNTER — Telehealth: Admitting: Physician Assistant

## 2023-08-11 DIAGNOSIS — J069 Acute upper respiratory infection, unspecified: Secondary | ICD-10-CM | POA: Diagnosis not present

## 2023-08-11 MED ORDER — FLUTICASONE PROPIONATE 50 MCG/ACT NA SUSP: 2.0000 | Freq: Every day | NASAL | 0 refills | Status: DC

## 2023-08-11 MED ORDER — BENZONATATE 100 MG PO CAPS: ORAL_CAPSULE | ORAL | 0 refills | Status: DC

## 2023-08-11 NOTE — Progress Notes (Signed)

## 2023-08-12 ENCOUNTER — Ambulatory Visit: Payer: Medicare HMO | Admitting: Family Medicine

## 2023-08-19 ENCOUNTER — Encounter: Payer: Self-pay | Admitting: Urology

## 2023-08-19 ENCOUNTER — Ambulatory Visit: Payer: Self-pay | Admitting: Urology

## 2023-08-29 ENCOUNTER — Other Ambulatory Visit: Payer: Self-pay | Admitting: Family Medicine

## 2023-08-29 DIAGNOSIS — Z1231 Encounter for screening mammogram for malignant neoplasm of breast: Secondary | ICD-10-CM

## 2023-09-03 ENCOUNTER — Ambulatory Visit
Admission: RE | Admit: 2023-09-03 | Discharge: 2023-09-03 | Disposition: A | Discharge status: Home / Self Care | Source: Ambulatory Visit | Attending: Family Medicine | Admitting: Family Medicine

## 2023-09-03 DIAGNOSIS — Z1231 Encounter for screening mammogram for malignant neoplasm of breast: Secondary | ICD-10-CM | POA: Diagnosis present

## 2023-09-04 ENCOUNTER — Ambulatory Visit (INDEPENDENT_AMBULATORY_CARE_PROVIDER_SITE_OTHER): Admitting: Family Medicine

## 2023-09-04 ENCOUNTER — Encounter: Payer: Self-pay | Admitting: Family Medicine

## 2023-09-04 VITALS — BP 146/81 | HR 94 | Resp 16 | Ht 65.0 in | Wt 265.3 lb

## 2023-09-04 DIAGNOSIS — R5383 Other fatigue: Secondary | ICD-10-CM

## 2023-09-04 DIAGNOSIS — N3941 Urge incontinence: Secondary | ICD-10-CM

## 2023-09-04 DIAGNOSIS — Z9889 Other specified postprocedural states: Secondary | ICD-10-CM

## 2023-09-04 DIAGNOSIS — K2100 Gastro-esophageal reflux disease with esophagitis, without bleeding: Secondary | ICD-10-CM | POA: Diagnosis not present

## 2023-09-04 DIAGNOSIS — E785 Hyperlipidemia, unspecified: Secondary | ICD-10-CM | POA: Diagnosis not present

## 2023-09-04 DIAGNOSIS — E559 Vitamin D deficiency, unspecified: Secondary | ICD-10-CM

## 2023-09-04 DIAGNOSIS — R739 Hyperglycemia, unspecified: Secondary | ICD-10-CM

## 2023-09-04 DIAGNOSIS — I10 Essential (primary) hypertension: Secondary | ICD-10-CM | POA: Diagnosis not present

## 2023-09-04 MED ORDER — OLMESARTAN MEDOXOMIL 40 MG PO TABS: 40.0000 mg | ORAL_TABLET | Freq: Every day | ORAL | 1 refills | Status: DC

## 2023-09-04 MED ORDER — ATORVASTATIN CALCIUM 20 MG PO TABS: 20.0000 mg | ORAL_TABLET | Freq: Every day | ORAL | 1 refills | Status: DC

## 2023-09-04 MED ORDER — METOPROLOL SUCCINATE ER 50 MG PO TB24: 50.0000 mg | ORAL_TABLET | Freq: Every day | ORAL | 1 refills | Status: DC

## 2023-09-04 NOTE — Addendum Note (Signed)
 Addended by: Alba Cory F on: 09/04/2023 10:53 AM   Modules accepted: Level of Service

## 2023-09-04 NOTE — Progress Notes (Signed)
 Name: Pamela Dixon   MRN: [id number]    DOB: 1956/06/08   Date:09/04/2023       Progress Note  Subjective  Chief Complaint  Chief Complaint  Patient presents with   Medical Management of Chronic Issues   HPI   Dyslipidemia: she is now taking Atorvastatin 10 mg daily and denies myalgias. We will recheck level today   Urinary incontinence: seen by Dr. Sherron Monday and was switched to [facility] and is working well for her. She noticed improvement of urgency, still has some stress incontinence when she is out of the house.  HTN: she was taking losartan hctz and toprol XL 25 mg, she was noticing increase in noticed increase in urinary frequency , we switched to Benicar 40 mg and increased dose of Metoprolol 50 mg , she is under more stress since daughter was diagnosed with cancer recently. At home bp has been well controlled.    Vitamin D deficiency: continue supplementation, last level at goal , we will recheck labs    GERD: she was having dysphagia and choking, had EGD recently and esophageal dilation, taking PPI and states feeling well   Morbid obesity: she is trying to change her diet, eating smaller portions, but states when stressed she eats more and has been under more stress. 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/2019   Vitamin D deficiency 08/19/2019   Essential hypertension, benign 06/10/2018   Class 3 severe obesity with serious comorbidity and body mass index (BMI) of 40.0 to 44.9 in adult [facility]) 06/10/2018    Past Surgical History:  Procedure Laterality Date   ABDOMINAL HYSTERECTOMY  Early 90's   COLONOSCOPY WITH PROPOFOL N/A 03/15/2017   Procedure: COLONOSCOPY  WITH PROPOFOL;  Surgeon: Scot Jun, MD;  Location[facility] ENDOSCOPY;  Service: Endoscopy;  Laterality: N/A;   ESOPHAGOGASTRODUODENOSCOPY (EGD) WITH PROPOFOL N/A 05/03/2023   Procedure: ESOPHAGOGASTRODUODENOSCOPY (EGD) WITH PROPOFOL;  Surgeon: Regis Bill, MD;  Location: [facility] ENDOSCOPY;  Service: Endoscopy;  Laterality: N/A;   TUBAL LIGATION  1985    Family History  Problem Relation Age of Onset   Other Mother    Diabetes Father    Cancer Father        colon skin cancer to head   Breast cancer Daughter     Social History   Tobacco Use   Smoking status: Never   Smokeless tobacco: Never  Substance Use Topics   Alcohol use: No     Current Outpatient Medications:    cholecalciferol (VITAMIN D3) 25 MCG (1000 UNIT) tablet, 1 each daily., Disp: , Rfl:    Coenzyme Q10-Vitamin E (QUNOL ULTRA COQ10 PO), , Disp: , Rfl:    fluticasone (FLONASE) 50 MCG/ACT nasal spray, Place 2 sprays into both nostrils daily., Disp: 16 g, Rfl: 0   metoprolol succinate (TOPROL-XL) 50 MG 24 hr tablet, Take 1 tablet (50 mg total) by mouth daily., Disp: 90 tablet, Rfl: 1   omeprazole (PRILOSEC) 20 MG capsule, Take by mouth., Disp: , Rfl:    Vibegron (GEMTESA) 75 MG TABS, Take 1 tablet (75 mg total) by mouth daily., Disp: 30 tablet, Rfl: 11   atorvastatin (LIPITOR) 20 MG tablet, Take 1 tablet (  20 mg total) by mouth daily., Disp: 90 tablet, Rfl: 1   olmesartan (BENICAR) 40 MG tablet, Take 1 tablet (40 mg total) by mouth daily., Disp: 90 tablet, Rfl: 1  No Known Allergies  I personally reviewed active problem list, medication list, allergies with the patient/caregiver today.   ROS  Ten systems reviewed and is negative except as mentioned in HPI    Objective  Vitals:   09/04/23 0937 09/04/23 1027 09/04/23 1028  BP: (!) 162/98 138/86 (!) 146/81  Pulse: 94    Resp: 16    SpO2: 95%    Weight: 265 lb 4.8 oz (120.3 kg)    Height: 5\' 5"  (1.651 m)      Body mass index is 44.15  kg/m.  Physical Exam  Constitutional: Patient appears well-developed and well-nourished. Obese  No distress.  HEENT: head atraumatic, normocephalic, pupils equal and reactive to light, neck supple Cardiovascular: Normal rate, regular rhythm and normal heart sounds.  No murmur heard. No BLE edema. Pulmonary/Chest: Effort normal and breath sounds normal. No respiratory distress. Abdominal: Soft.  There is no tenderness. Psychiatric: Patient has a normal mood and affect. behavior is normal. Judgment and thought content normal.   Recent Results (from the past 2160 hours)  Urinalysis, Complete     Status: Abnormal   Collection Time: 06/24/23  2:07 PM  Result Value Ref Range   Specific Gravity, UA 1.015 1.005 - 1.030   pH, UA 5.5 5.0 - 7.5   Color, UA Yellow Yellow   Appearance Ur Cloudy (A) Clear   Leukocytes,UA Trace (A) Negative   Protein,UA Negative Negative/Trace   Glucose, UA Negative Negative   Ketones, UA Negative Negative   RBC, UA Trace (A) Negative   Bilirubin, UA Negative Negative   Urobilinogen, Ur 0.2 0.2 - 1.0 mg/dL   Nitrite, UA Positive (A) Negative   Microscopic Examination See below:   Microscopic Examination     Status: Abnormal   Collection Time: 06/24/23  2:07 PM   Urine  Result Value Ref Range   WBC, UA 6-10 (A) 0 - 5 /hpf   RBC, Urine 0-2 0 - 2 /hpf   Epithelial Cells (non renal) 0-10 0 - 10 /hpf   Bacteria, UA Many (A) None seen/Few  CULTURE, URINE COMPREHENSIVE     Status: Abnormal   Collection Time: 06/24/23  2:38 PM   Specimen: Urine   UR  Result Value Ref Range   Urine Culture, Comprehensive Final report (A)    Organism ID, Bacteria Escherichia coli (A)     Comment: Cefazolin <=4 ug/mL Cefazolin with an MIC <=16 predicts susceptibility to the oral agents cefaclor, cefdinir, cefpodoxime, cefprozil, cefuroxime, cephalexin, and loracarbef when used for therapy of uncomplicated urinary tract infections due to E. coli, Klebsiella pneumoniae, and  Proteus mirabilis. Greater than 100,000 colony forming units per mL    ANTIMICROBIAL SUSCEPTIBILITY Comment     Comment:       ** S = Susceptible; I = Intermediate; R = Resistant **                    P = Positive; N = Negative             MICS are expressed in micrograms per mL    Antibiotic                 RSLT#1    RSLT#2    RSLT#3    RSLT#4 Amoxicillin/Clavulanic Acid    S  Ampicillin                     S Cefepime                       S Ceftriaxone                    S Cefuroxime                     S Ciprofloxacin                  S Ertapenem                      S Gentamicin                     S Imipenem                       S Levofloxacin                   S Meropenem                      S Nitrofurantoin                 S Piperacillin/Tazobactam        S Tetracycline                   S Tobramycin                     S Trimethoprim/Sulfa             S     Diabetic Foot Exam:     PHQ2/9:    09/04/2023    9:33 AM 02/12/2023   10:35 AM 02/07/2023    9:57 AM 08/30/2022   11:16 AM 03/29/2022    9:45 AM  Depression screen PHQ 2/9  Decreased Interest 0 0 0 0 0  Down, Depressed, Hopeless 0 0 0 0 0  PHQ - 2 Score 0 0 0 0 0  Altered sleeping 0 0  0 0  Tired, decreased energy 0 0  0 0  Change in appetite 0 0  0 0  Feeling bad or failure about yourself  0 0  0 0  Trouble concentrating 0 0  0 0  Moving slowly or fidgety/restless 0 0  0 0  Suicidal thoughts 0 0  0 0  PHQ-9 Score 0 0  0 0  Difficult doing work/chores Not difficult at all        phq 9 is negative  Fall Risk:    09/04/2023    9:33 AM 02/12/2023   10:34 AM 02/06/2023    7:41 AM 10/01/2022   10:18 AM 08/30/2022   11:16 AM  Fall Risk   Falls in the past year? 0 0 0 0 0  Number falls in past yr: 0  0  0  Injury with Fall? 0  0  0  Risk for fall due to : No Fall Risks No Fall Risks No Fall Risks  No Fall Risks  Follow up Falls prevention discussed;Education provided;Falls evaluation completed Falls  prevention discussed Education provided;Falls prevention discussed  Falls prevention discussed     Assessment & Plan  1. Essential hypertension, benign (Primary)  - olmesartan (BENICAR) 40 MG tablet; Take  1 tablet (40 mg total) by mouth daily.  Dispense: 90 tablet; Refill: 1 - CBC with Differential/Platelet - COMPLETE METABOLIC PANEL WITH GFR -  we will continue current dose of medications for now and continue to monitor  2. Dyslipidemia  - atorvastatin (LIPITOR) 20 MG tablet; Take 1 tablet (20 mg total) by mouth daily.  Dispense: 90 tablet; Refill: 1 - Lipid panel  3. Hyperglycemia  - Hemoglobin A1c  4. Gastroesophageal reflux disease with esophagitis, unspecified whether hemorrhage  Taking PPI   5. Vitamin D deficiency  - VITAMIN D 25 Hydroxy (Vit-D Deficiency, Fractures)  6. Urgency incontinence  Seeing Urologist   7. History of esophageal dilatation  Doing well   8. Fatigue, unspecified type  - B12 and Folate Panel - TSH

## 2023-09-04 NOTE — Patient Instructions (Signed)
Kegel Exercises  Kegel exercises can help strengthen your pelvic floor muscles. The pelvic floor is a group of muscles that support your rectum, small intestine, and bladder. In females, pelvic floor muscles also help support the uterus. These muscles help you control the flow of urine and stool (feces). Kegel exercises are painless and simple. They do not require any equipment. Your provider may suggest Kegel exercises to: Improve bladder and bowel control. Improve sexual response. Improve weak pelvic floor muscles after surgery to remove the uterus (hysterectomy) or after pregnancy, in females. Improve weak pelvic floor muscles after prostate gland removal or surgery, in males. Kegel exercises involve squeezing your pelvic floor muscles. These are the same muscles you squeeze when you try to stop the flow of urine or keep from passing gas. The exercises can be done while sitting, standing, or lying down, but it is best to vary your position. Ask your health care provider which exercises are safe for you. Do exercises exactly as told by your health care provider and adjust them as directed. Do not begin these exercises until told by your health care provider. Exercises How to do Kegel exercises: Squeeze your pelvic floor muscles tight. You should feel a tight lift in your rectal area. If you are a female, you should also feel a tightness in your vaginal area. Keep your stomach, buttocks, and legs relaxed. Hold the muscles tight for up to 10 seconds. Breathe normally. Relax your muscles for up to 10 seconds. Repeat as told by your health care provider. Repeat this exercise daily as told by your health care provider. Continue to do this exercise for at least 4-6 weeks, or for as long as told by your health care provider. You may be referred to a physical therapist who can help you learn more about how to do Kegel exercises. Depending on your condition, your health care provider may  recommend: Varying how long you squeeze your muscles. Doing several sets of exercises every day. Doing exercises for several weeks. Making Kegel exercises a part of your regular exercise routine. This information is not intended to replace advice given to you by your health care provider. Make sure you discuss any questions you have with your health care provider. Document Revised: 10/06/2020 Document Reviewed: 10/06/2020 Elsevier Patient Education  2024 Elsevier Inc.  

## 2023-09-05 ENCOUNTER — Encounter: Payer: Self-pay | Admitting: Family Medicine

## 2023-09-05 LAB — CBC WITH DIFFERENTIAL/PLATELET
Absolute Lymphocytes: 3125 {cells}/uL (ref 850–3900)
Absolute Monocytes: 677 {cells}/uL (ref 200–950)
Basophils Absolute: 86 {cells}/uL (ref 0–200)
Basophils Relative: 1.2 %
Eosinophils Absolute: 331 {cells}/uL (ref 15–500)
Eosinophils Relative: 4.6 %
HCT: 44 % (ref 35.0–45.0)
Hemoglobin: 14.9 g/dL (ref 11.7–15.5)
MCH: 30.6 pg (ref 27.0–33.0)
MCHC: 33.9 g/dL (ref 32.0–36.0)
MCV: 90.3 fL (ref 80.0–100.0)
MPV: 9.4 fL (ref 7.5–12.5)
Monocytes Relative: 9.4 %
Neutro Abs: 2981 {cells}/uL (ref 1500–7800)
Neutrophils Relative %: 41.4 %
Platelets: 285 10*3/uL (ref 140–400)
RBC: 4.87 10*6/uL (ref 3.80–5.10)
RDW: 11.8 % (ref 11.0–15.0)
Total Lymphocyte: 43.4 %
WBC: 7.2 10*3/uL (ref 3.8–10.8)

## 2023-09-05 LAB — B12 AND FOLATE PANEL
Folate: 15.5 ng/mL
Vitamin B-12: 330 pg/mL (ref 200–1100)

## 2023-09-05 LAB — LIPID PANEL
Cholesterol: 141 mg/dL (ref <200)
HDL: 51 mg/dL (ref >=50)
LDL Cholesterol (Calc): 70 mg/dL
Non-HDL Cholesterol (Calc): 90 mg/dL (ref <130)
Total CHOL/HDL Ratio: 2.8 (calc) (ref <5.0)
Triglycerides: 115 mg/dL (ref <150)

## 2023-09-05 LAB — COMPLETE METABOLIC PANEL WITHOUT GFR
AG Ratio: 1.7 (calc) (ref 1.0–2.5)
ALT: 18 U/L (ref 6–29)
AST: 17 U/L (ref 10–35)
Albumin: 4.3 g/dL (ref 3.6–5.1)
Alkaline phosphatase (APISO): 55 U/L (ref 37–153)
BUN: 15 mg/dL (ref 7–25)
CO2: 28 mmol/L (ref 20–32)
Calcium: 9.3 mg/dL (ref 8.6–10.4)
Chloride: 105 mmol/L (ref 98–110)
Creat: 0.65 mg/dL (ref 0.50–1.05)
Globulin: 2.5 g/dL (ref 1.9–3.7)
Glucose, Bld: 108 mg/dL — ABNORMAL HIGH (ref 65–99)
Potassium: 4.3 mmol/L (ref 3.5–5.3)
Sodium: 140 mmol/L (ref 135–146)
Total Bilirubin: 0.5 mg/dL (ref 0.2–1.2)
Total Protein: 6.8 g/dL (ref 6.1–8.1)

## 2023-09-05 LAB — HEMOGLOBIN A1C
Hgb A1c MFr Bld: 5.8 %{Hb} — ABNORMAL HIGH (ref <5.7)
Mean Plasma Glucose: 120 mg/dL
eAG (mmol/L): 6.6 mmol/L

## 2023-09-05 LAB — VITAMIN D 25 HYDROXY (VIT D DEFICIENCY, FRACTURES): Vit D, 25-Hydroxy: 33 ng/mL (ref 30–100)

## 2023-09-05 LAB — TSH: TSH: 2.28 m[IU]/L (ref 0.40–4.50)

## 2023-09-09 ENCOUNTER — Ambulatory Visit: Admitting: Urology

## 2023-09-09 VITALS — BP 151/86 | HR 93

## 2023-09-09 DIAGNOSIS — N3941 Urge incontinence: Secondary | ICD-10-CM

## 2023-09-09 LAB — URINALYSIS, COMPLETE
Bilirubin, UA: NEGATIVE
Glucose, UA: NEGATIVE
Ketones, UA: NEGATIVE
Nitrite, UA: POSITIVE — AB
Protein,UA: NEGATIVE
RBC, UA: NEGATIVE
Specific Gravity, UA: 1.025 (ref 1.005–1.030)
Urobilinogen, Ur: 0.2 mg/dL (ref 0.2–1.0)
pH, UA: 5.5 (ref 5.0–7.5)

## 2023-09-09 LAB — MICROSCOPIC EXAMINATION: Epithelial Cells (non renal): >10 /HPF — AB (ref 0–10)

## 2023-09-09 MED ORDER — GEMTESA 75 MG PO TABS: 1.0000 | ORAL_TABLET | Freq: Every day | ORAL | 11 refills | Status: AC

## 2023-09-09 NOTE — Progress Notes (Signed)
 09/09/2023 9:37 AM   Pamela Dixon 03-31-1956 [id number]  Referring provider: Alba Cory, MD [address] [place],  [place] [address]  Chief Complaint  Patient presents with   Follow-up    Urgency incontinence    HPI: I was consulted to assess the patient's urinary incontinence.  She was having urge incontinence wearing 3 pads a day damp to significantly wet.  She was having moderate to severe bedwetting.  No stress incontinence.  She was put on Vesicare and now wears 1 pad a day when she goes out in public.  She still has enuresis but it is milder in severity   She was voiding every hour prior to treatment but every 3 hours now.  She is get up once at night.  Flow was moderate.   She has had a hysterectomy     Patient had grade 2 hypermobility bladder neck and negative cough test.  She had a small grade 2 cystocele.  A little bit limited since she was not down near the end of the table.  It was asymptomatic    Patient has urge incontinence and bedwetting.  She has had a significant improvement in incontinence bedwetting and frequency on Vesicare.  Patient says she is at least 50% better but still has bedwetting and some urge incontinence.  I I decided to put her on Gemtesa as monotherapy versus combination have her come back in 6 weeks for repeat urine, urine culture and cystoscopy.  I went over urodynamics in [place] in full detail she understands we may needed in the future.  I also mentions that sometimes will use combination treatment.   She had a urinalysis that was positive for blood and bacteria but there was not enough to send for culture.  Clinically not infected.  I will check urinalysis next visit.  Patient says she is dramatically improved with a more than 80% improvement.  Much less urge incontinence and  frequency.  Clinically not infected.  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  She had a grade 1 cystocele with a little bit of rotational descent and almost this small grade 2 cystocele that was otherwise asymptomatic.   I could not perform cystoscopy.  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  I did not dilate her   Clinically patient has urge incontinence and bedwetting. No blood in urine. Picture drawn regarding meatal stenosis. Picture drawn regarding mild prolapse unrelated. Patient will come back on Vesicare in combination with Gemtesa samples and prescription. If she still leaking I did discuss urodynamics and we will order them. I do not plan on doing urethral dilation. We will proceed accordingly. It appears she has ankle edema and likely has a nocturnal diuresis she was also described   Today Frequency stable.  Last urine culture positive Patient still on Gemtesa monotherapy and doing very well.  She is nearly completely dry and very happy and no infections   PMH: Past Medical History:  Diagnosis Date   Gastroesophageal reflux disease 03/01/2022   High cholesterol    Hypertension    Mixed hyperlipidemia     Surgical History: Past Surgical History:  Procedure Laterality Date   ABDOMINAL HYSTERECTOMY  Early 90's   COLONOSCOPY WITH PROPOFOL N/A 03/15/2017   Procedure: COLONOSCOPY WITH PROPOFOL;  Surgeon: Scot Jun, MD;  Location:  [facility] ENDOSCOPY;  Service: Endoscopy;  Laterality: N/A;   ESOPHAGOGASTRODUODENOSCOPY (EGD) WITH PROPOFOL N/A 05/03/2023   Procedure: ESOPHAGOGASTRODUODENOSCOPY (EGD) WITH PROPOFOL;  Surgeon: Regis Bill, MD;  Location: [facility] ENDOSCOPY;  Service: Endoscopy;  Laterality: N/A;   TUBAL LIGATION  1985    Home Medications:  Allergies as of 09/09/2023   No Known Allergies      Medication List        Accurate as of September 09, 2023  9:37 AM. If you have any questions, ask your nurse or doctor.          atorvastatin 20 MG tablet Commonly known  as: LIPITOR Take 1 tablet (20 mg total) by mouth daily.   cholecalciferol 25 MCG (1000 UNIT) tablet Commonly known as: VITAMIN D3 1 each daily.   fluticasone 50 MCG/ACT nasal spray Commonly known as: FLONASE Place 2 sprays into both nostrils daily.   Gemtesa 75 MG Tabs Generic drug: Vibegron Take 1 tablet (75 mg total) by mouth daily.   metoprolol succinate 50 MG 24 hr tablet Commonly known as: TOPROL-XL Take 1 tablet (50 mg total) by mouth daily.   olmesartan 40 MG tablet Commonly known as: Benicar Take 1 tablet (40 mg total) by mouth daily.   omeprazole 20 MG capsule Commonly known as: PRILOSEC Take by mouth.   QUNOL ULTRA COQ10 PO        Allergies: No Known Allergies  Family History: Family History  Problem Relation Age of Onset   Other Mother    Diabetes Father    Cancer Father        colon skin cancer to head   Breast cancer Daughter     Social History:  reports that she has never smoked. She has never used smokeless tobacco. She reports that she does not drink alcohol and does not use drugs.  ROS:                                        Physical Exam: There were no vitals taken for this visit.  Constitutional:  Alert and oriented, No acute distress. HEENT: [place] AT, moist mucus membranes.  Trachea midline, no masses.  Laboratory Data: Lab Results  Component Value Date   WBC 7.2 09/04/2023   HGB 14.9 09/04/2023   HCT 44.0 09/04/2023   MCV 90.3 09/04/2023   PLT 285 09/04/2023    Lab Results  Component Value Date   CREATININE 0.65 09/04/2023    No results found for: "PSA"  No results found for: "TESTOSTERONE"  Lab Results  Component Value Date   HGBA1C 5.8 (H) 09/04/2023    Urinalysis    Component Value Date/Time   APPEARANCEUR Cloudy (A) 06/24/2023 1407   GLUCOSEU Negative 06/24/2023 1407   BILIRUBINUR Negative 06/24/2023 1407   PROTEINUR Negative 06/24/2023 1407   NITRITE Positive (A) 06/24/2023 1407    LEUKOCYTESUR Trace (A) 06/24/2023 1407    Pertinent Imaging: No blood in urine  Assessment & Plan: 11 x 30 sent to pharmacy Gemtesa and I will see near  1. Urgency incontinence (Primary)  - Urinalysis, Complete   No follow-ups on file.  Martina Sinner, MD  [facility] [address] [place], [place] [address] [phone]

## 2023-09-16 IMAGING — MG MM DIGITAL SCREENING BILAT W/ TOMO AND CAD
8 series · 8 of 24 positions shown · non-contrast
Comparison: Previous exam(s).

CLINICAL DATA: Screening.

EXAM:
DIGITAL SCREENING BILATERAL MAMMOGRAM WITH TOMOSYNTHESIS AND CAD
TECHNIQUE: Bilateral screening digital craniocaudal and mediolateral oblique
mammograms were obtained. Bilateral screening digital breast
tomosynthesis was performed. The images were evaluated with
computer-aided detection.

[L CC synth-2D]
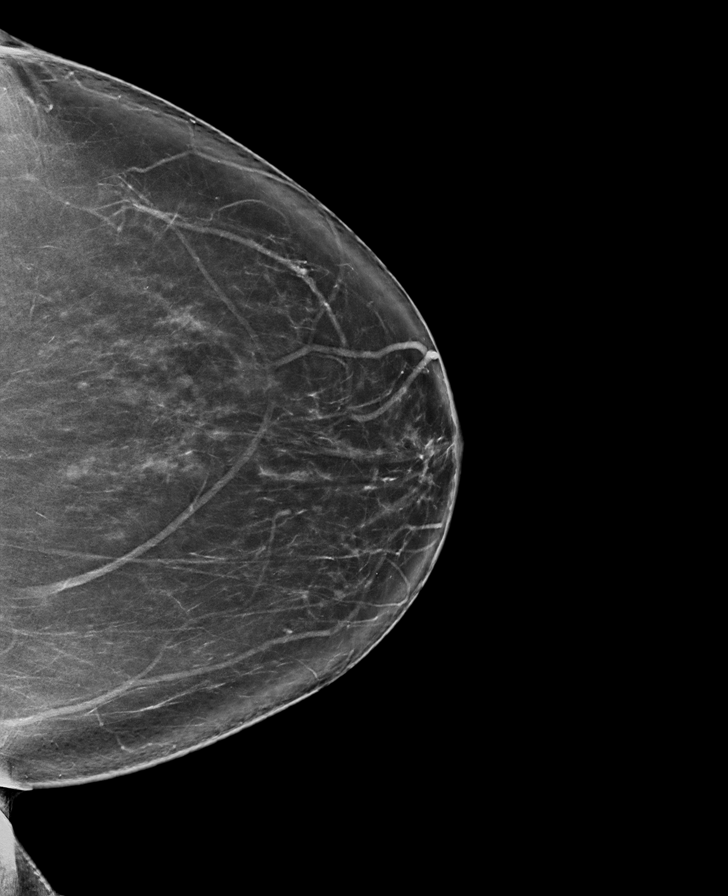

[R MLO synth-2D]
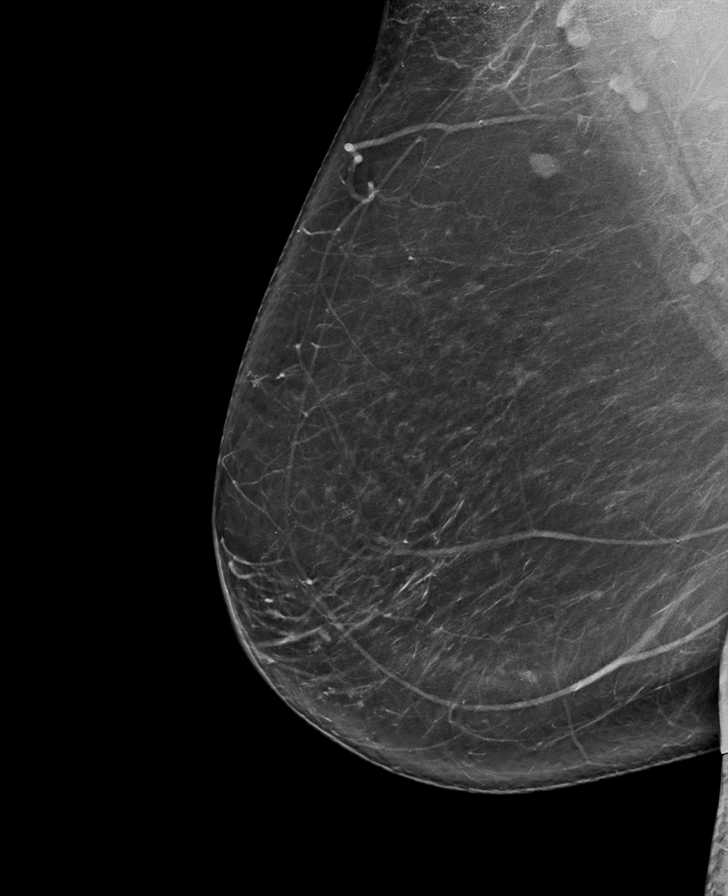

[R CC synth-2D]
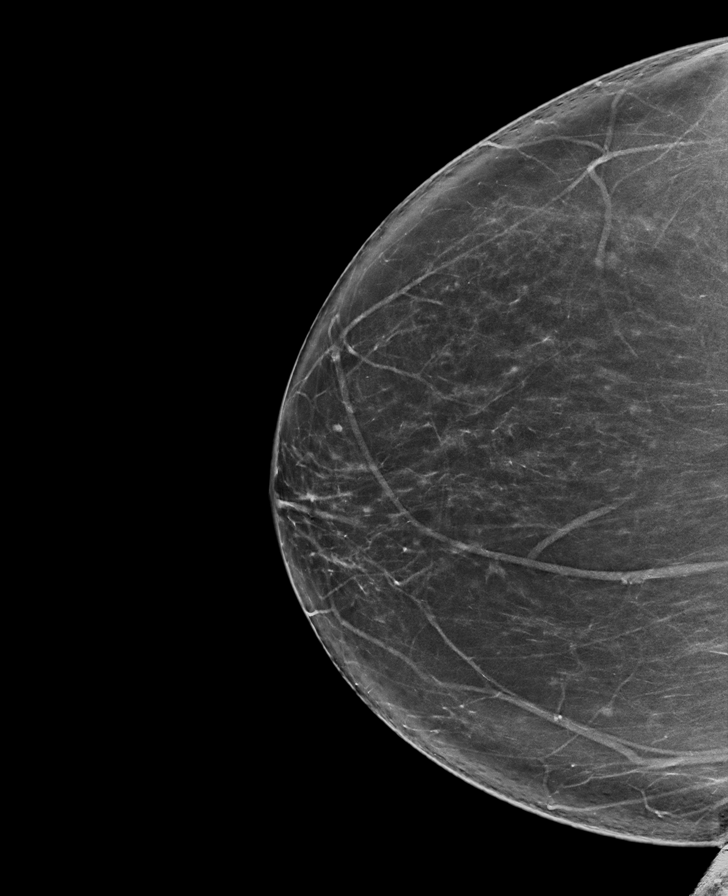

[L MLO synth-2D]
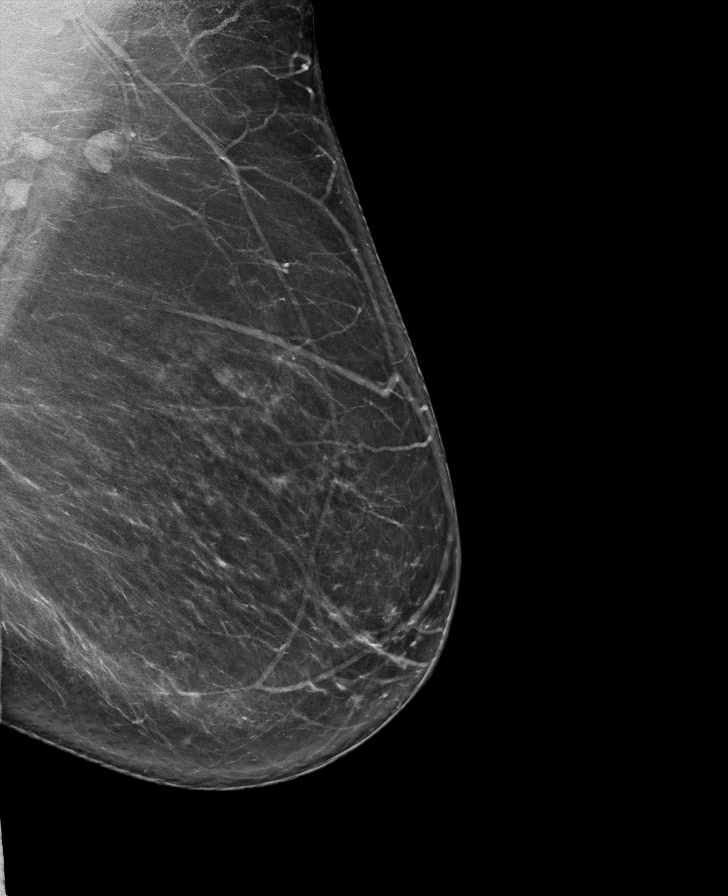

[L CC tomo · tomo slice 43/85.0]
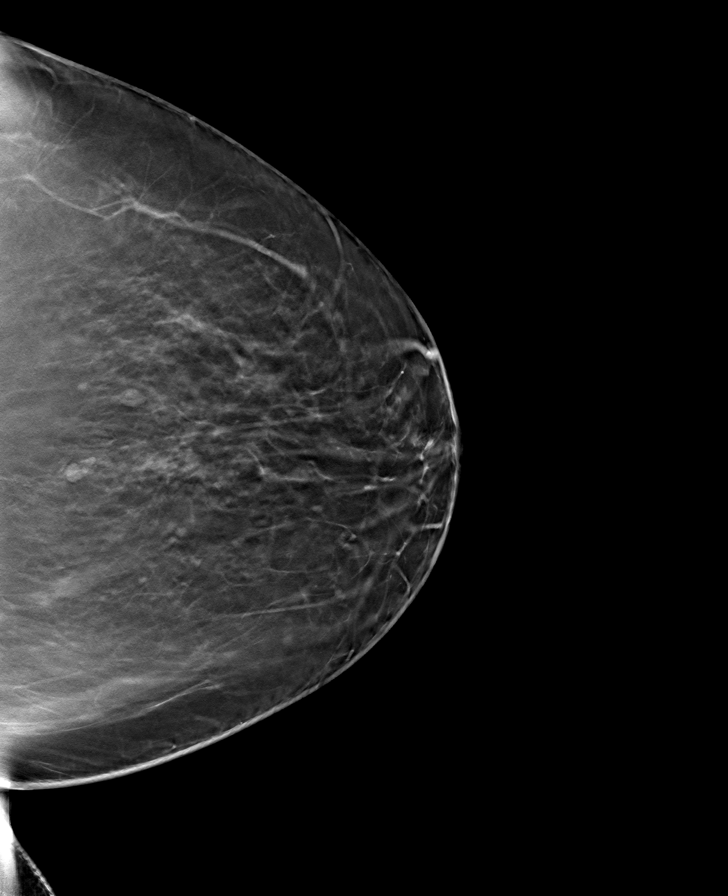

[R CC tomo · tomo slice 41/80.0]
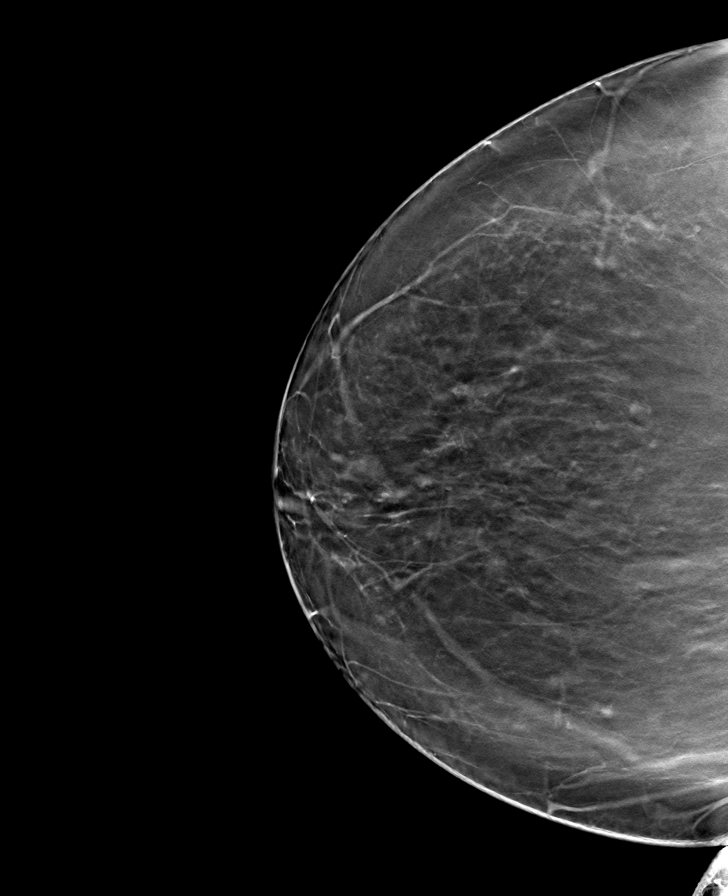

[L MLO tomo · tomo slice 44/87.0]
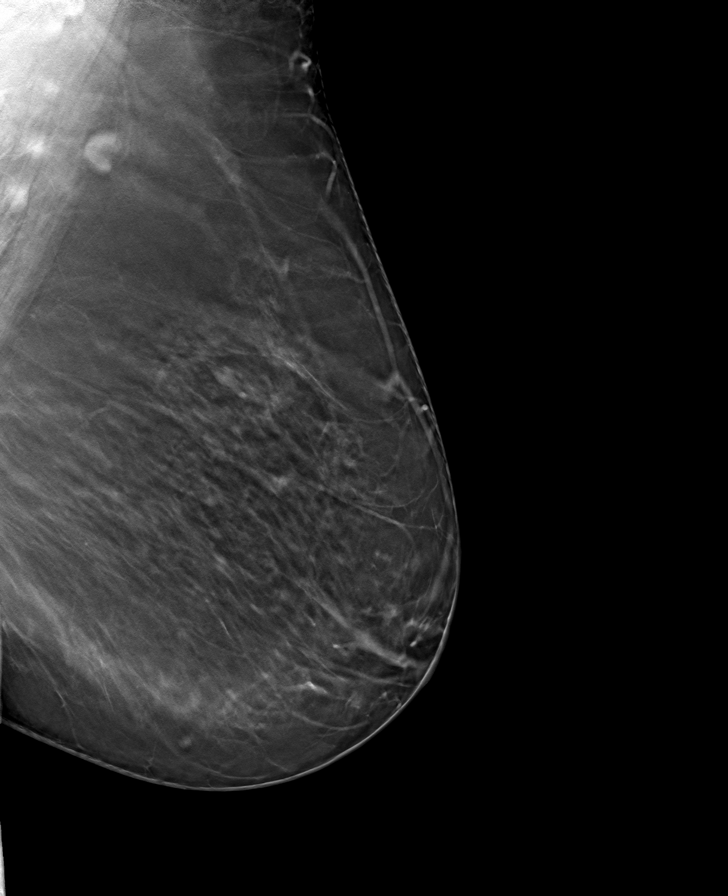

[R MLO tomo · tomo slice 45/89.0]
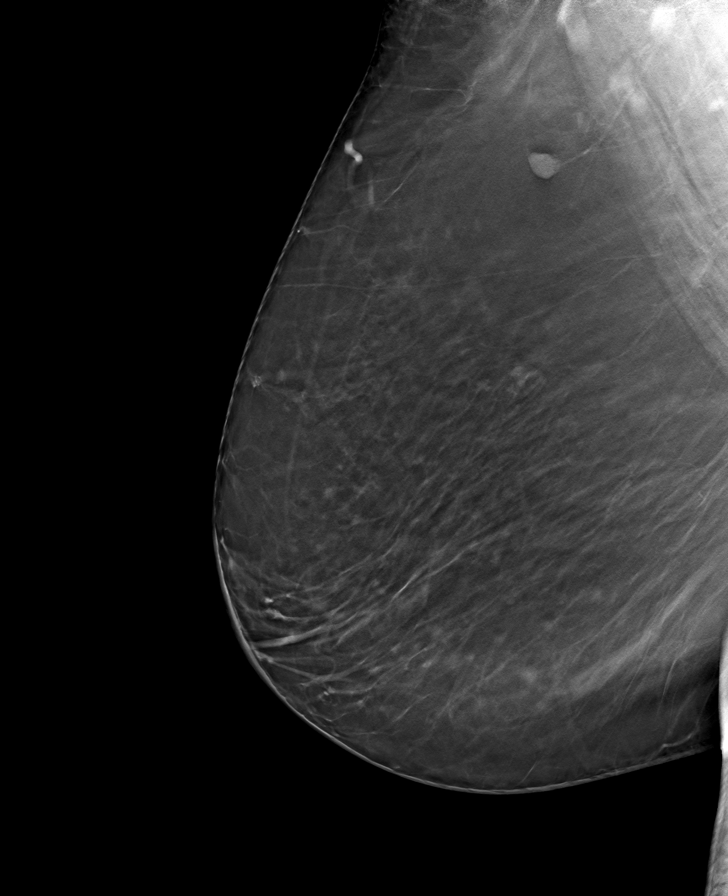

[8 of 24 positions shown; findings below may reference images not displayed]

ACR Breast Density Category b: There are scattered areas of
fibroglandular density.
FINDINGS: There are no findings suspicious for malignancy.
IMPRESSION: No mammographic evidence of malignancy. A result letter of this
screening mammogram will be mailed directly to the patient.

RECOMMENDATION:
Screening mammogram in one year. (Code:51-O-LD2)

BI-RADS CATEGORY  1: Negative.

## 2023-11-12 ENCOUNTER — Encounter: Payer: Self-pay | Admitting: Urology

## 2024-03-11 ENCOUNTER — Other Ambulatory Visit: Payer: Self-pay | Admitting: Family Medicine

## 2024-03-11 DIAGNOSIS — I10 Essential (primary) hypertension: Secondary | ICD-10-CM

## 2024-03-11 DIAGNOSIS — E785 Hyperlipidemia, unspecified: Secondary | ICD-10-CM

## 2024-03-13 NOTE — Anesthesia Preprocedure Evaluation (Addendum)
 Anesthesia Evaluation  Patient identified by MRN, date of birth, ID band Patient awake    Reviewed: Allergy & Precautions, H&P , NPO status , Patient's Chart, lab work & pertinent test results  Airway Mallampati: II  TM Distance: >3 FB Neck ROM: Full    Dental no notable dental hx.    Pulmonary neg pulmonary ROS   Pulmonary exam normal breath sounds clear to auscultation       Cardiovascular hypertension, negative cardio ROS Normal cardiovascular exam Rhythm:Regular Rate:Normal     Neuro/Psych negative neurological ROS  negative psych ROS   GI/Hepatic negative GI ROS, Neg liver ROS,GERD  ,,  Endo/Other  negative endocrine ROS    Renal/GU negative Renal ROS  negative genitourinary   Musculoskeletal negative musculoskeletal ROS (+)    Abdominal   Peds negative pediatric ROS (+)  Hematology negative hematology ROS (+)   Anesthesia Other Findings Hypertension  High cholesterol Mixed hyperlipidemia  Gastroesophageal reflux disease    Reproductive/Obstetrics negative OB ROS                              Anesthesia Physical Anesthesia Plan  ASA: 2  Anesthesia Plan: MAC   Post-op Pain Management:    Induction: Intravenous  PONV Risk Score and Plan:   Airway Management Planned: Natural Airway and Nasal Cannula  Additional Equipment:   Intra-op Plan:   Post-operative Plan:   Informed Consent: I have reviewed the patients History and Physical, chart, labs and discussed the procedure including the risks, benefits and alternatives for the proposed anesthesia with the patient or authorized representative who has indicated his/her understanding and acceptance.     Dental Advisory Given  Plan Discussed with: Anesthesiologist, CRNA and Surgeon  Anesthesia Plan Comments: (Patient consented for risks of anesthesia including but not limited to:  - adverse reactions to medications -  damage to eyes, teeth, lips or other oral mucosa - nerve damage due to positioning  - sore throat or hoarseness - Damage to heart, brain, nerves, lungs, other parts of body or loss of life  Patient voiced understanding and assent.)         Anesthesia Quick Evaluation

## 2024-03-16 ENCOUNTER — Encounter: Payer: Self-pay | Admitting: Ophthalmology

## 2024-03-17 NOTE — Discharge Instructions (Signed)

## 2024-03-18 ENCOUNTER — Other Ambulatory Visit: Payer: Self-pay | Admitting: Physician Assistant

## 2024-03-18 ENCOUNTER — Other Ambulatory Visit: Payer: Self-pay | Admitting: Family Medicine

## 2024-03-18 DIAGNOSIS — J069 Acute upper respiratory infection, unspecified: Secondary | ICD-10-CM

## 2024-03-18 DIAGNOSIS — I10 Essential (primary) hypertension: Secondary | ICD-10-CM

## 2024-03-18 NOTE — Telephone Encounter (Signed)
 Appt sch'd 04/15/24 with Dr Glenard

## 2024-03-18 NOTE — Telephone Encounter (Signed)
Needs f/u for further refills

## 2024-03-19 ENCOUNTER — Encounter: Payer: Self-pay | Admitting: Ophthalmology

## 2024-03-19 ENCOUNTER — Other Ambulatory Visit: Payer: Self-pay

## 2024-03-19 ENCOUNTER — Ambulatory Visit
Admission: RE | Admit: 2024-03-19 | Discharge: 2024-03-19 | Disposition: A | Discharge status: Home / Self Care | Attending: Ophthalmology | Admitting: Ophthalmology

## 2024-03-19 ENCOUNTER — Ambulatory Visit: Payer: Self-pay | Admitting: Anesthesiology

## 2024-03-19 ENCOUNTER — Encounter
Admission: RE | Disposition: A | Discharge status: Home / Self Care | Payer: Self-pay | Source: Home / Self Care | Attending: Ophthalmology

## 2024-03-19 DIAGNOSIS — I10 Essential (primary) hypertension: Secondary | ICD-10-CM | POA: Insufficient documentation

## 2024-03-19 DIAGNOSIS — H2511 Age-related nuclear cataract, right eye: Secondary | ICD-10-CM | POA: Diagnosis present

## 2024-03-19 HISTORY — DX: Prediabetes: R73.03

## 2024-03-19 SURGERY — PHACOEMULSIFICATION, CATARACT, WITH IOL INSERTION
Anesthesia: Monitor Anesthesia Care | Site: Eye | Laterality: Right

## 2024-03-19 MED ORDER — ARMC OPHTHALMIC DILATING DROPS
1.0000 | OPHTHALMIC | Status: DC | PRN
  Administered 2024-03-19 (×2): 1 via OPHTHALMIC

## 2024-03-19 MED ORDER — SIGHTPATH DOSE#1 BSS IO SOLN
INTRAOCULAR | Status: DC | PRN
  Administered 2024-03-19: 83 mL via OPHTHALMIC

## 2024-03-19 MED ORDER — ARMC OPHTHALMIC DILATING DROPS
OPHTHALMIC | Status: AC
  Filled 2024-03-19: qty 0.5

## 2024-03-19 MED ORDER — FENTANYL CITRATE (PF) 100 MCG/2ML IJ SOLN
INTRAMUSCULAR | Status: AC
  Filled 2024-03-19: qty 2

## 2024-03-19 MED ORDER — FENTANYL CITRATE (PF) 100 MCG/2ML IJ SOLN
INTRAMUSCULAR | Status: DC | PRN
  Administered 2024-03-19 (×2): 50 ug via INTRAVENOUS

## 2024-03-19 MED ORDER — LIDOCAINE HCL (PF) 2 % IJ SOLN
INTRAOCULAR | Status: DC | PRN
Start: 2024-03-19 — End: 2024-03-19
  Administered 2024-03-19: 4 mL via INTRAOCULAR

## 2024-03-19 MED ORDER — MIDAZOLAM HCL 2 MG/2ML IJ SOLN
INTRAMUSCULAR | Status: DC | PRN
  Administered 2024-03-19: 2 mg via INTRAVENOUS

## 2024-03-19 MED ORDER — MIDAZOLAM HCL 2 MG/2ML IJ SOLN
INTRAMUSCULAR | Status: AC
  Filled 2024-03-19: qty 2

## 2024-03-19 MED ORDER — LACTATED RINGERS IV SOLN: INTRAVENOUS | Status: DC

## 2024-03-19 MED ORDER — BRIMONIDINE TARTRATE-TIMOLOL 0.2-0.5 % OP SOLN
OPHTHALMIC | Status: DC | PRN
  Administered 2024-03-19: 1 [drp] via OPHTHALMIC

## 2024-03-19 MED ORDER — TETRACAINE HCL 0.5 % OP SOLN
OPHTHALMIC | Status: AC
  Filled 2024-03-19: qty 4

## 2024-03-19 MED ORDER — SIGHTPATH DOSE#1 NA HYALUR & NA CHOND-NA HYALUR IO KIT
PACK | INTRAOCULAR | Status: DC | PRN
Start: 2024-03-19 — End: 2024-03-19
  Administered 2024-03-19: 1 via OPHTHALMIC

## 2024-03-19 MED ORDER — SIGHTPATH DOSE#1 BSS IO SOLN
INTRAOCULAR | Status: DC | PRN
  Administered 2024-03-19: 15 mL via INTRAOCULAR

## 2024-03-19 MED ORDER — MOXIFLOXACIN HCL 0.5 % OP SOLN
OPHTHALMIC | Status: DC | PRN
Start: 2024-03-19 — End: 2024-03-19
  Administered 2024-03-19: .2 mL via OPHTHALMIC

## 2024-03-19 MED ORDER — TETRACAINE HCL 0.5 % OP SOLN
1.0000 [drp] | OPHTHALMIC | Status: DC | PRN
  Administered 2024-03-19 (×3): 1 [drp] via OPHTHALMIC

## 2024-03-19 SURGICAL SUPPLY — 10 items
DISSECTOR HYDRO NUCLEUS 50X22 (MISCELLANEOUS) ×1 IMPLANT
DRSG TEGADERM 2-3/8X2-3/4 SM (GAUZE/BANDAGES/DRESSINGS) ×1 IMPLANT
FEE CATARACT SUITE SIGHTPATH (MISCELLANEOUS) ×1 IMPLANT
GLOVE BIOGEL PI IND STRL 8 (GLOVE) ×1 IMPLANT
GLOVE SURG LX STRL 7.5 STRW (GLOVE) ×1 IMPLANT
GLOVE SURG SYN 6.5 PF PI BL (GLOVE) ×1 IMPLANT
LENS IOL TECNIS MONO 14.5 (Intraocular Lens) IMPLANT
NDL FILTER BLUNT 18X1 1/2 (NEEDLE) ×1 IMPLANT
NEEDLE FILTER BLUNT 18X1 1/2 (NEEDLE) ×1 IMPLANT
SYR 3ML LL SCALE MARK (SYRINGE) ×1 IMPLANT

## 2024-03-19 NOTE — Transfer of Care (Signed)
 Immediate Anesthesia Transfer of Care Note  Patient: Pamela Dixon  Procedure(s) Performed: PHACOEMULSIFICATION, CATARACT, WITH IOL INSERTION 5.87 00:56.2 (Right: Eye)  Patient Location: PACU  Anesthesia Type: MAC  Level of Consciousness: awake, alert  and patient cooperative  Airway and Oxygen Therapy: Patient Spontanous Breathing   Post-op Assessment: Post-op Vital signs reviewed, Patient's Cardiovascular Status Stable, Respiratory Function Stable, Patent Airway and No signs of Nausea or vomiting  Post-op Vital Signs: Reviewed and stable  Complications: No notable events documented.

## 2024-03-19 NOTE — Anesthesia Postprocedure Evaluation (Signed)
 Anesthesia Post Note  Patient: Pamela Dixon  Procedure(s) Performed: PHACOEMULSIFICATION, CATARACT, WITH IOL INSERTION 5.87 00:56.2 (Right: Eye)  Patient location during evaluation: PACU Anesthesia Type: MAC Level of consciousness: awake and alert Pain management: pain level controlled Vital Signs Assessment: post-procedure vital signs reviewed and stable Respiratory status: spontaneous breathing, nonlabored ventilation, respiratory function stable and patient connected to nasal cannula oxygen Cardiovascular status: stable and blood pressure returned to baseline Postop Assessment: no apparent nausea or vomiting Anesthetic complications: no   No notable events documented.   Last Vitals:  Vitals:   03/19/24 0943 03/19/24 0949  BP: (!) 143/93 (!) 171/94  Pulse: 76 71  Resp: 11 20  Temp: (!) 36.3 C (!) 36.3 C  SpO2: 100% 99%    Last Pain:  Vitals:   03/19/24 0949  TempSrc:   PainSc: 0-No pain                 [name] C Tyger Wichman

## 2024-03-19 NOTE — Op Note (Signed)
 OPERATIVE NOTE  FAVOR KREH [id number] 03/19/2024   PREOPERATIVE DIAGNOSIS: Nuclear sclerotic cataract right eye. H25.11   POSTOPERATIVE DIAGNOSIS: Nuclear sclerotic cataract right eye. H25.11   PROCEDURE:  Phacoemulsification with posterior chamber intraocular lens placement of the right eye  Ultrasound time: Procedure(s): PHACOEMULSIFICATION, CATARACT, WITH IOL INSERTION 5.87 00:56.2 (Right)  LENS:   Implant Name Type Inv. Item Serial No. Manufacturer Lot No. LRB No. Used Action  LENS IOL TECNIS MONO 14.5 - [id number] Intraocular Lens LENS IOL TECNIS MONO 14.5 [id number] [employer]  Right 1 Implanted      SURGEON:  Feliciano HERO. Enola, MD   ANESTHESIA:  Topical with tetracaine drops, augmented with 1% preservative-free intracameral lidocaine .   COMPLICATIONS:  None.   DESCRIPTION OF PROCEDURE:  The patient was identified in the holding room and transported to the operating room and placed in the supine position under the operating microscope.  The right eye was identified as the operative eye, which was prepped and draped in the usual sterile ophthalmic fashion.   A 1 millimeter clear-corneal paracentesis was made superotemporally. Preservative-free 1% lidocaine  mixed with 1:1,000 bisulfite-free aqueous solution of epinephrine was injected into the anterior chamber. The anterior chamber was then filled with Viscoat viscoelastic. A 2.4 millimeter keratome was used to make a clear-corneal incision inferotemporally. A curvilinear capsulorrhexis was made with a cystotome and capsulorrhexis forceps. Balanced salt solution was used to hydrodissect and hydrodelineate the nucleus. Phacoemulsification was then used to remove the lens nucleus and epinucleus. The remaining cortex was then removed using the irrigation and aspiration handpiece. Provisc was then placed into the capsular bag to distend it for lens placement. A +14.50 D [id number] intraocular lens was then injected into the capsular bag. The  remaining viscoelastic was aspirated.   Wounds were hydrated with balanced salt solution.  The anterior chamber was inflated to a physiologic pressure with balanced salt solution.  No wound leaks were noted. Moxifloxacin was injected intracamerally.  Timolol and Brimonidine drops were applied to the eye.  The patient was taken to the recovery room in stable condition without complications of anesthesia or surgery.  Feliciano Hugger [place] 03/19/2024, 9:41 AM

## 2024-03-19 NOTE — H&P (Signed)
 [facility]   Primary Care Physician:  Glenard Mire, MD Ophthalmologist: Dr. Feliciano Ober  Pre-Procedure History & Physical: HPI:  Pamela Dixon is a 68 y.o. female here for cataract surgery.   Past Medical History:  Diagnosis Date   High cholesterol    Hypertension    Mixed hyperlipidemia    Pre-diabetes    diet controlled    Past Surgical History:  Procedure Laterality Date   ABDOMINAL HYSTERECTOMY  Early 90's   COLONOSCOPY WITH PROPOFOL  N/A 03/15/2017   Procedure: COLONOSCOPY WITH PROPOFOL ;  Surgeon: Viktoria Lamar DASEN, MD;  Location: [facility] ENDOSCOPY;  Service: Endoscopy;  Laterality: N/A;   ESOPHAGOGASTRODUODENOSCOPY (EGD) WITH PROPOFOL  N/A 05/03/2023   Procedure: ESOPHAGOGASTRODUODENOSCOPY (EGD) WITH PROPOFOL ;  Surgeon: Maryruth Ole DASEN, MD;  Location: [facility] ENDOSCOPY;  Service: Endoscopy;  Laterality: N/A;   TUBAL LIGATION  1985    Prior to Admission medications   Medication Sig Start Date End Date Taking? Authorizing Provider  cyanocobalamin (VITAMIN B12) 1000 MCG tablet Take 1,000 mcg by mouth daily.   Yes [provider]  atorvastatin  (LIPITOR) 20 MG tablet Take 1 tablet (20 mg total) by mouth daily. 09/04/23   Sowles, Krichna, MD  cholecalciferol (VITAMIN D3) 25 MCG (1000 UNIT) tablet 1 each daily.    [provider]  metoprolol  succinate (TOPROL -XL) 50 MG 24 hr tablet Take 1 tablet (50 mg total) by mouth daily. 09/04/23   Sowles, Krichna, MD  olmesartan  (BENICAR ) 40 MG tablet TAKE 1 TABLET(40 MG) BY MOUTH DAILY 03/18/24   Sowles, Krichna, MD  Vibegron  (GEMTESA ) 75 MG TABS Take 1 tablet (75 mg total) by mouth daily. 09/09/23   Gaston Hamilton, MD    Allergies as of 03/02/2024   (No Known Allergies)    Family History  Problem Relation Age of Onset   Other Mother    Diabetes Father    Cancer Father        colon skin cancer to head   Breast cancer Daughter     Social History   Socioeconomic History   Marital status: Married     Spouse name: Fairy   Number of children: 2   Years of education: 12   Highest education level: 12th grade  Occupational History   Not on file  Tobacco Use   Smoking status: Never   Smokeless tobacco: Never  Vaping Use   Vaping status: Never Used  Substance and Sexual Activity   Alcohol use: Yes    Comment: occasionally   Drug use: Never   Sexual activity: Yes  Other Topics Concern   Not on file  Social History Narrative   Not on file   Social Drivers of Health   Financial Resource Strain: Low Risk  (09/02/2023)   Overall Financial Resource Strain (CARDIA)    Difficulty of Paying Living Expenses: Not hard at all  Food Insecurity: No Food Insecurity (09/02/2023)   Hunger Vital Sign    Worried About Running Out of Food in the Last Year: Never true    Ran Out of Food in the Last Year: Never true  Transportation Needs: No Transportation Needs (09/02/2023)   PRAPARE - Administrator, Civil Service (Medical): No    Lack of Transportation (Non-Medical): No  Physical Activity: Insufficiently Active (02/10/2023)   Exercise Vital Sign    Days of Exercise per Week: 3 days    Minutes of Exercise per Session: 20 min  Stress: No Stress Concern Present (09/02/2023)   [employer]  of Occupational Health - Occupational Stress Questionnaire    Feeling of Stress : Not at all  Social Connections: Unknown (09/02/2023)   Social Connection and Isolation Panel    Frequency of Communication with Friends and Family: More than three times a week    Frequency of Social Gatherings with Friends and Family: Once a week    Attends Religious Services: Not on Insurance claims handler of Clubs or Organizations: No    Attends Banker Meetings: Not on file    Marital Status: Married  Intimate Partner Violence: Not At Risk (02/07/2023)   Humiliation, Afraid, Rape, and Kick questionnaire    Fear of Current or Ex-Partner: No    Emotionally Abused: No    Physically Abused: No     Sexually Abused: No    Review of Systems: See HPI, otherwise negative ROS  Physical Exam: Ht 5' 7 (1.702 m)   Wt 117.9 kg   BMI 40.72 kg/m  General:   Alert, cooperative in NAD Head:  Normocephalic and atraumatic. Respiratory:  Normal work of breathing. Cardiovascular:  RRR  Impression/Plan: Pamela Dixon is here for cataract surgery.  Risks, benefits, limitations, and alternatives regarding cataract surgery have been reviewed with the patient.  Questions have been answered.  All parties agreeable.   Feliciano Bryan Ober, MD  03/19/2024, 7:10 AM

## 2024-03-22 ENCOUNTER — Other Ambulatory Visit: Payer: Self-pay | Admitting: Family Medicine

## 2024-03-22 DIAGNOSIS — I10 Essential (primary) hypertension: Secondary | ICD-10-CM

## 2024-03-26 NOTE — Anesthesia Preprocedure Evaluation (Addendum)
 Anesthesia Evaluation  Patient identified by MRN, date of birth, ID band Patient awake    Reviewed: Allergy & Precautions, H&P , NPO status , Patient's Chart, lab work & pertinent test results  Airway Mallampati: II  TM Distance: >3 FB Neck ROM: Full    Dental no notable dental hx.    Pulmonary neg pulmonary ROS   Pulmonary exam normal breath sounds clear to auscultation       Cardiovascular hypertension, negative cardio ROS Normal cardiovascular exam Rhythm:Regular Rate:Normal     Neuro/Psych negative neurological ROS  negative psych ROS   GI/Hepatic negative GI ROS, Neg liver ROS,GERD  ,,  Endo/Other  negative endocrine ROS    Renal/GU negative Renal ROS  negative genitourinary   Musculoskeletal negative musculoskeletal ROS (+)    Abdominal   Peds negative pediatric ROS (+)  Hematology negative hematology ROS (+)   Anesthesia Other Findings Previous cataract 03-19-24 Dr. Ola  Hypertension  High cholesterol Mixed hyperlipidemia  Pre-diabetes    Reproductive/Obstetrics negative OB ROS                              Anesthesia Physical Anesthesia Plan  ASA: 2  Anesthesia Plan: MAC   Post-op Pain Management:    Induction: Intravenous  PONV Risk Score and Plan:   Airway Management Planned: Natural Airway and Nasal Cannula  Additional Equipment:   Intra-op Plan:   Post-operative Plan:   Informed Consent: I have reviewed the patients History and Physical, chart, labs and discussed the procedure including the risks, benefits and alternatives for the proposed anesthesia with the patient or authorized representative who has indicated his/her understanding and acceptance.     Dental Advisory Given  Plan Discussed with: Anesthesiologist, CRNA and Surgeon  Anesthesia Plan Comments: (Patient consented for risks of anesthesia including but not limited to:  - adverse  reactions to medications - damage to eyes, teeth, lips or other oral mucosa - nerve damage due to positioning  - sore throat or hoarseness - Damage to heart, brain, nerves, lungs, other parts of body or loss of life  Patient voiced understanding and assent.)         Anesthesia Quick Evaluation

## 2024-04-01 NOTE — Discharge Instructions (Signed)

## 2024-04-02 ENCOUNTER — Encounter
Admission: RE | Disposition: A | Discharge status: Home / Self Care | Payer: Self-pay | Source: Home / Self Care | Attending: Ophthalmology

## 2024-04-02 ENCOUNTER — Ambulatory Visit: Payer: Self-pay | Admitting: Anesthesiology

## 2024-04-02 ENCOUNTER — Encounter: Payer: Self-pay | Admitting: Ophthalmology

## 2024-04-02 ENCOUNTER — Ambulatory Visit
Admission: RE | Admit: 2024-04-02 | Discharge: 2024-04-02 | Disposition: A | Discharge status: Home / Self Care | Attending: Ophthalmology | Admitting: Ophthalmology

## 2024-04-02 ENCOUNTER — Other Ambulatory Visit: Payer: Self-pay

## 2024-04-02 DIAGNOSIS — Z961 Presence of intraocular lens: Secondary | ICD-10-CM | POA: Diagnosis not present

## 2024-04-02 DIAGNOSIS — Z833 Family history of diabetes mellitus: Secondary | ICD-10-CM | POA: Diagnosis not present

## 2024-04-02 DIAGNOSIS — Z79899 Other long term (current) drug therapy: Secondary | ICD-10-CM | POA: Diagnosis not present

## 2024-04-02 DIAGNOSIS — Z9841 Cataract extraction status, right eye: Secondary | ICD-10-CM | POA: Diagnosis not present

## 2024-04-02 DIAGNOSIS — K219 Gastro-esophageal reflux disease without esophagitis: Secondary | ICD-10-CM | POA: Diagnosis not present

## 2024-04-02 DIAGNOSIS — H2512 Age-related nuclear cataract, left eye: Secondary | ICD-10-CM | POA: Diagnosis present

## 2024-04-02 DIAGNOSIS — E782 Mixed hyperlipidemia: Secondary | ICD-10-CM | POA: Diagnosis not present

## 2024-04-02 DIAGNOSIS — I10 Essential (primary) hypertension: Secondary | ICD-10-CM | POA: Diagnosis not present

## 2024-04-02 SURGERY — PHACOEMULSIFICATION, CATARACT, WITH IOL INSERTION
Anesthesia: Monitor Anesthesia Care | Site: Eye | Laterality: Left

## 2024-04-02 MED ORDER — TETRACAINE HCL 0.5 % OP SOLN
OPHTHALMIC | Status: AC
  Filled 2024-04-02: qty 4

## 2024-04-02 MED ORDER — FENTANYL CITRATE (PF) 100 MCG/2ML IJ SOLN
INTRAMUSCULAR | Status: DC | PRN
  Administered 2024-04-02 (×2): 50 ug via INTRAVENOUS

## 2024-04-02 MED ORDER — LIDOCAINE HCL (PF) 2 % IJ SOLN
INTRAOCULAR | Status: DC | PRN
  Administered 2024-04-02: 4 mL via INTRAOCULAR

## 2024-04-02 MED ORDER — SIGHTPATH DOSE#1 NA HYALUR & NA CHOND-NA HYALUR IO KIT
PACK | INTRAOCULAR | Status: DC | PRN
  Administered 2024-04-02: 1 via OPHTHALMIC

## 2024-04-02 MED ORDER — BRIMONIDINE TARTRATE-TIMOLOL 0.2-0.5 % OP SOLN
OPHTHALMIC | Status: DC | PRN
  Administered 2024-04-02: 1 [drp] via OPHTHALMIC

## 2024-04-02 MED ORDER — ARMC OPHTHALMIC DILATING DROPS
OPHTHALMIC | Status: AC
  Filled 2024-04-02: qty 0.5

## 2024-04-02 MED ORDER — FENTANYL CITRATE (PF) 100 MCG/2ML IJ SOLN
INTRAMUSCULAR | Status: AC
  Filled 2024-04-02: qty 2

## 2024-04-02 MED ORDER — TETRACAINE HCL 0.5 % OP SOLN
1.0000 [drp] | OPHTHALMIC | Status: DC | PRN
  Administered 2024-04-02 (×3): 1 [drp] via OPHTHALMIC

## 2024-04-02 MED ORDER — ARMC OPHTHALMIC DILATING DROPS
1.0000 | OPHTHALMIC | Status: DC | PRN
  Administered 2024-04-02 (×3): 1 via OPHTHALMIC

## 2024-04-02 MED ORDER — MIDAZOLAM HCL 2 MG/2ML IJ SOLN
INTRAMUSCULAR | Status: AC
  Filled 2024-04-02: qty 2

## 2024-04-02 MED ORDER — SIGHTPATH DOSE#1 BSS IO SOLN
INTRAOCULAR | Status: DC | PRN
  Administered 2024-04-02: 15 mL via INTRAOCULAR

## 2024-04-02 MED ORDER — MOXIFLOXACIN HCL 0.5 % OP SOLN
OPHTHALMIC | Status: DC | PRN
  Administered 2024-04-02: .2 mL via OPHTHALMIC

## 2024-04-02 MED ORDER — LACTATED RINGERS IV SOLN: INTRAVENOUS | Status: DC

## 2024-04-02 MED ORDER — SIGHTPATH DOSE#1 BSS IO SOLN
INTRAOCULAR | Status: DC | PRN
  Administered 2024-04-02: 92 mL via OPHTHALMIC

## 2024-04-02 MED ORDER — MIDAZOLAM HCL (PF) 2 MG/2ML IJ SOLN
INTRAMUSCULAR | Status: DC | PRN
  Administered 2024-04-02 (×2): 1 mg via INTRAVENOUS

## 2024-04-02 SURGICAL SUPPLY — 10 items
DISSECTOR HYDRO NUCLEUS 50X22 (MISCELLANEOUS) ×1 IMPLANT
DRSG TEGADERM 2-3/8X2-3/4 SM (GAUZE/BANDAGES/DRESSINGS) ×1 IMPLANT
FEE CATARACT SUITE SIGHTPATH (MISCELLANEOUS) ×1 IMPLANT
GLOVE BIOGEL PI IND STRL 8 (GLOVE) ×1 IMPLANT
GLOVE SURG LX STRL 7.5 STRW (GLOVE) ×1 IMPLANT
GLOVE SURG SYN 6.5 PF PI BL (GLOVE) ×1 IMPLANT
LENS IOL ENVISTA ASPR 14.5 (Intraocular Lens) IMPLANT
NDL FILTER BLUNT 18X1 1/2 (NEEDLE) ×1 IMPLANT
NEEDLE FILTER BLUNT 18X1 1/2 (NEEDLE) ×1 IMPLANT
SYR 3ML LL SCALE MARK (SYRINGE) ×1 IMPLANT

## 2024-04-02 NOTE — Anesthesia Postprocedure Evaluation (Signed)
 Anesthesia Post Note  Patient: Pamela Dixon  Procedure(s) Performed: PHACOEMULSIFICATION, CATARACT, WITH IOL INSERTION 9.30 01:08.9 (Left: Eye)  Patient location during evaluation: PACU Anesthesia Type: MAC Level of consciousness: awake and alert Pain management: pain level controlled Vital Signs Assessment: post-procedure vital signs reviewed and stable Respiratory status: spontaneous breathing, nonlabored ventilation, respiratory function stable and patient connected to nasal cannula oxygen Cardiovascular status: stable and blood pressure returned to baseline Postop Assessment: no apparent nausea or vomiting Anesthetic complications: no   No notable events documented.   Last Vitals:  Vitals:   04/02/24 0758 04/02/24 0802  BP: (!) 148/73 (!) 152/71  Pulse: 75 74  Resp: 12 19  Temp: (!) 36.3 C 36.4 C  SpO2:  100%    Last Pain:  Vitals:   04/02/24 0802  TempSrc:   PainSc: 0-No pain                 Adelina Collard C Shaun Zuccaro

## 2024-04-02 NOTE — Op Note (Signed)
 OPERATIVE NOTE  Pamela Dixon [id number] 04/02/2024   PREOPERATIVE DIAGNOSIS: Nuclear sclerotic cataract left eye. H25.12   POSTOPERATIVE DIAGNOSIS: Nuclear sclerotic cataract left eye. H25.12   PROCEDURE:  Phacoemulsification with posterior chamber intraocular lens placement of the left eye  Ultrasound time: Procedure(s): PHACOEMULSIFICATION, CATARACT, WITH IOL INSERTION 9.30 01:08.9 (Left)  LENS:   Implant Name Type Inv. Item Serial No. Manufacturer Lot No. LRB No. Used Action  LENS IOL ENVISTA ASPR 14.5 - [id number] Intraocular Lens LENS IOL ENVISTA ASPR 14.5 [id number] [employer]  Left 1 Implanted      SURGEON:  Feliciano HERO. Enola, MD   ANESTHESIA:  Topical with tetracaine drops, augmented with 1% preservative-free intracameral lidocaine .   COMPLICATIONS:  None.   DESCRIPTION OF PROCEDURE:  The patient was identified in the holding room and transported to the operating room and placed in the supine position under the operating microscope.  The left eye was identified as the operative eye, which was prepped and draped in the usual sterile ophthalmic fashion.   A 1 millimeter clear-corneal paracentesis was made inferotemporally. Preservative-free 1% lidocaine  mixed with 1:1,000 bisulfite-free aqueous solution of epinephrine was injected into the anterior chamber. The anterior chamber was then filled with Viscoat viscoelastic. A 2.4 millimeter keratome was used to make a clear-corneal incision superotemporally. A curvilinear capsulorrhexis was made with a cystotome and capsulorrhexis forceps. Balanced salt solution was used to hydrodissect and hydrodelineate the nucleus. Phacoemulsification was then used to remove the lens nucleus and epinucleus. The remaining cortex was then removed using the irrigation and aspiration handpiece. Provisc was then placed into the capsular bag to distend it for lens placement. A +14.50 D EA intraocular lens was then injected into the capsular bag. The  remaining viscoelastic was aspirated.   Wounds were hydrated with balanced salt solution.  The anterior chamber was inflated to a physiologic pressure with balanced salt solution.  No wound leaks were noted. Moxifloxacin was injected intracamerally.  Timolol and Brimonidine drops were applied to the eye.  The patient was taken to the recovery room in stable condition without complications of anesthesia or surgery.  [employer] 04/02/2024, 7:56 AM

## 2024-04-02 NOTE — H&P (Signed)
 [facility]   Primary Care Physician:  Glenard Mire, MD Ophthalmologist: Dr. Feliciano Ober  Pre-Procedure History & Physical: HPI:  Pamela Dixon is a 68 y.o. female here for cataract surgery.   Past Medical History:  Diagnosis Date   High cholesterol    Hypertension    Mixed hyperlipidemia    Pre-diabetes    diet controlled    Past Surgical History:  Procedure Laterality Date   ABDOMINAL HYSTERECTOMY  Early 90's   CATARACT EXTRACTION W/PHACO Right 03/19/2024   Procedure: PHACOEMULSIFICATION, CATARACT, WITH IOL INSERTION 5.87 00:56.2;  Surgeon: Ober Feliciano Hugger, MD;  Location: [facility];  Service: Ophthalmology;  Laterality: Right;   COLONOSCOPY WITH PROPOFOL  N/A 03/15/2017   Procedure: COLONOSCOPY WITH PROPOFOL ;  Surgeon: Viktoria Lamar DASEN, MD;  Location: [facility] ENDOSCOPY;  Service: Endoscopy;  Laterality: N/A;   ESOPHAGOGASTRODUODENOSCOPY (EGD) WITH PROPOFOL  N/A 05/03/2023   Procedure: ESOPHAGOGASTRODUODENOSCOPY (EGD) WITH PROPOFOL ;  Surgeon: Maryruth Ole DASEN, MD;  Location: [facility] ENDOSCOPY;  Service: Endoscopy;  Laterality: N/A;   TUBAL LIGATION  1985    Prior to Admission medications   Medication Sig Start Date End Date Taking? Authorizing Provider  atorvastatin  (LIPITOR) 20 MG tablet Take 1 tablet (20 mg total) by mouth daily. 09/04/23  Yes Sowles, Krichna, MD  cholecalciferol (VITAMIN D3) 25 MCG (1000 UNIT) tablet 1 each daily.   Yes [provider]  cyanocobalamin (VITAMIN B12) 1000 MCG tablet Take 1,000 mcg by mouth daily.   Yes [provider]  metoprolol  succinate (TOPROL -XL) 50 MG 24 hr tablet TAKE 1 TABLET(50 MG) BY MOUTH DAILY 03/23/24  Yes Sowles, Krichna, MD  olmesartan  (BENICAR ) 40 MG tablet TAKE 1 TABLET(40 MG) BY MOUTH DAILY 03/18/24  Yes Sowles, Krichna, MD  Vibegron  (GEMTESA ) 75 MG TABS Take 1 tablet (75 mg total) by mouth daily. 09/09/23  Yes Gaston Hamilton, MD    Allergies as of 03/02/2024   (No Known Allergies)     Family History  Problem Relation Age of Onset   Other Mother    Diabetes Father    Cancer Father        colon skin cancer to head   Breast cancer Daughter     Social History   Socioeconomic History   Marital status: Married    Spouse name: Fairy   Number of children: 2   Years of education: 12   Highest education level: 12th grade  Occupational History   Not on file  Tobacco Use   Smoking status: Never   Smokeless tobacco: Never  Vaping Use   Vaping status: Never Used  Substance and Sexual Activity   Alcohol use: Yes    Comment: occasionally   Drug use: Never   Sexual activity: Yes  Other Topics Concern   Not on file  Social History Narrative   Not on file   Social Drivers of Health   Financial Resource Strain: Low Risk  (09/02/2023)   Overall Financial Resource Strain (CARDIA)    Difficulty of Paying Living Expenses: Not hard at all  Food Insecurity: No Food Insecurity (09/02/2023)   Hunger Vital Sign    Worried About Running Out of Food in the Last Year: Never true    Ran Out of Food in the Last Year: Never true  Transportation Needs: No Transportation Needs (09/02/2023)   PRAPARE - Administrator, Civil Service (Medical): No    Lack of Transportation (Non-Medical): No  Physical Activity: Insufficiently Active (02/10/2023)   Exercise Vital  Sign    Days of Exercise per Week: 3 days    Minutes of Exercise per Session: 20 min  Stress: No Stress Concern Present (09/02/2023)   [employer] of Occupational Health - Occupational Stress Questionnaire    Feeling of Stress : Not at all  Social Connections: Unknown (09/02/2023)   Social Connection and Isolation Panel    Frequency of Communication with Friends and Family: More than three times a week    Frequency of Social Gatherings with Friends and Family: Once a week    Attends Religious Services: Not on Insurance claims handler of Clubs or Organizations: No    Attends Banker Meetings:  Not on file    Marital Status: Married  Intimate Partner Violence: Not At Risk (02/07/2023)   Humiliation, Afraid, Rape, and Kick questionnaire    Fear of Current or Ex-Partner: No    Emotionally Abused: No    Physically Abused: No    Sexually Abused: No    Review of Systems: See HPI, otherwise negative ROS  Physical Exam: BP (!) 185/80   Pulse 84   Temp 99.4 F (37.4 C) (Temporal)   Resp 14   Ht 5' 7.01 (1.702 m)   Wt 122.7 kg   SpO2 96%   BMI 42.35 kg/m  General:   Alert, cooperative in NAD Head:  Normocephalic and atraumatic. Respiratory:  Normal work of breathing. Cardiovascular:  RRR  Impression/Plan: Pamela Dixon is here for cataract surgery.  Risks, benefits, limitations, and alternatives regarding cataract surgery have been reviewed with the patient.  Questions have been answered.  All parties agreeable.   Feliciano Bryan Ober, MD  04/02/2024, 7:13 AM

## 2024-04-02 NOTE — Transfer of Care (Signed)
 Immediate Anesthesia Transfer of Care Note  Patient: Pamela Dixon  Procedure(s) Performed: PHACOEMULSIFICATION, CATARACT, WITH IOL INSERTION 9.30 01:08.9 (Left: Eye)  Patient Location: PACU  Anesthesia Type: MAC  Level of Consciousness: awake, alert  and patient cooperative  Airway and Oxygen Therapy: Patient Spontanous Breathing and Patient connected to supplemental oxygen  Post-op Assessment: Post-op Vital signs reviewed, Patient's Cardiovascular Status Stable, Respiratory Function Stable, Patent Airway and No signs of Nausea or vomiting  Post-op Vital Signs: Reviewed and stable  Complications: No notable events documented.

## 2024-04-15 ENCOUNTER — Encounter: Payer: Self-pay | Admitting: Family Medicine

## 2024-04-15 ENCOUNTER — Ambulatory Visit (INDEPENDENT_AMBULATORY_CARE_PROVIDER_SITE_OTHER): Admitting: Family Medicine

## 2024-04-15 VITALS — BP 130/78 | HR 82 | Temp 97.8°F | Resp 18 | Ht 67.0 in | Wt 271.9 lb

## 2024-04-15 DIAGNOSIS — R7303 Prediabetes: Secondary | ICD-10-CM

## 2024-04-15 DIAGNOSIS — Z23 Encounter for immunization: Secondary | ICD-10-CM

## 2024-04-15 DIAGNOSIS — R739 Hyperglycemia, unspecified: Secondary | ICD-10-CM

## 2024-04-15 DIAGNOSIS — E559 Vitamin D deficiency, unspecified: Secondary | ICD-10-CM | POA: Diagnosis not present

## 2024-04-15 DIAGNOSIS — E785 Hyperlipidemia, unspecified: Secondary | ICD-10-CM

## 2024-04-15 DIAGNOSIS — I10 Essential (primary) hypertension: Secondary | ICD-10-CM | POA: Diagnosis not present

## 2024-04-15 DIAGNOSIS — E538 Deficiency of other specified B group vitamins: Secondary | ICD-10-CM

## 2024-04-15 MED ORDER — ATORVASTATIN CALCIUM 20 MG PO TABS: 20.0000 mg | ORAL_TABLET | Freq: Every day | ORAL | 1 refills | Status: AC

## 2024-04-15 MED ORDER — OLMESARTAN MEDOXOMIL 40 MG PO TABS: 40.0000 mg | ORAL_TABLET | Freq: Every day | ORAL | 1 refills | Status: AC

## 2024-04-15 MED ORDER — METOPROLOL SUCCINATE ER 50 MG PO TB24: 50.0000 mg | ORAL_TABLET | Freq: Every day | ORAL | 1 refills | Status: AC

## 2024-04-15 MED ORDER — METFORMIN HCL ER 500 MG PO TB24
500.0000 mg | ORAL_TABLET | Freq: Every day | ORAL | 1 refills | Status: AC
Start: 2024-04-15 — End: ?

## 2024-04-15 NOTE — Progress Notes (Signed)
 Name: Pamela Dixon   MRN: [id number]    DOB: May 10, 1956   Date:04/15/2024       Progress Note  Subjective  Chief Complaint  Chief Complaint  Patient presents with   Medical Management of Chronic Issues   Hyperlipidemia   Hypertension   Discussed the use of AI scribe software for clinical note transcription with the patient, who gave verbal consent to proceed.  History of Present Illness Basma Buchner is a 68 year old female who presents for a follow-up visit after recent cataract surgery.  She is recovering well from cataract surgery on both eyes, with the right eye operated on October 9th and the left on October 23rd. She is satisfied with the results and continues to use glasses for reading.  Her blood pressure was previously elevated at 146/81 mmHg in March. At home, her blood pressure typically reads in the high 130s. She experiences a high heart rate, which she attributes to weight gain and lack of exercise. She has not yet consulted a cardiologist. Her current medications for hypertension include metoprolol  and olmesartan .  She has a BMI over 40 and attributes her weight gain to stress, particularly due to her daughter's recent breast cancer diagnosis. She has not engaged in regular physical activity and experiences shortness of breath with exertion. She has not tried structured weight loss programs.  Her daughter, Camelia Hummer, was diagnosed with breast cancer and is undergoing radiation therapy. Crystal's genetic testing was negative. She has another child, a son named Fairy Dawn Junior, who is healthy.  She has a history of prediabetes with an A1c of 5.8% and elevated fasting blood sugar. She is taking metformin to manage her blood sugar levels. She also has a history of low vitamin D  and B12, for which she takes supplements. Her cholesterol levels have improved with atorvastatin , which she continues to take.  She has no current symptoms of reflux, which she had  experienced in the past. Her urinary incontinence is managed with Gemtesa .    Patient Active Problem List   Diagnosis Date Noted   Dyslipidemia 04/12/2023   Gastroesophageal reflux disease 03/01/2022   Urgency incontinence 03/01/2022   Postmenopausal estrogen deficiency 08/19/2019   Vitamin D  deficiency 08/19/2019   Essential hypertension, benign 06/10/2018   Class 3 severe obesity with serious comorbidity and body mass index (BMI) of 40.0 to 44.9 in adult [facility]) 06/10/2018    Past Surgical History:  Procedure Laterality Date   ABDOMINAL HYSTERECTOMY  Early 90's   CATARACT EXTRACTION W/PHACO Right 03/19/2024   Procedure: PHACOEMULSIFICATION, CATARACT, WITH IOL INSERTION 5.87 00:56.2;  Surgeon: Enola Feliciano Hugger, MD;  Location: [facility];  Service: Ophthalmology;  Laterality: Right;   CATARACT EXTRACTION W/PHACO Left 04/02/2024   Procedure: PHACOEMULSIFICATION, CATARACT, WITH IOL INSERTION 9.30 01:08.9;  Surgeon: Enola Feliciano Hugger, MD;  Location: [facility];  Service: Ophthalmology;  Laterality: Left;   COLONOSCOPY WITH PROPOFOL  N/A 03/15/2017   Procedure: COLONOSCOPY WITH PROPOFOL ;  Surgeon: Viktoria Lamar DASEN, MD;  Location: [facility] ENDOSCOPY;  Service: Endoscopy;  Laterality: N/A;   ESOPHAGOGASTRODUODENOSCOPY (EGD) WITH PROPOFOL  N/A 05/03/2023   Procedure: ESOPHAGOGASTRODUODENOSCOPY (EGD) WITH PROPOFOL ;  Surgeon: Maryruth Ole DASEN, MD;  Location: [facility] ENDOSCOPY;  Service: Endoscopy;  Laterality: N/A;   TUBAL LIGATION  1985    Family History  Problem Relation Age of Onset   Other Mother    Diabetes Father    Cancer Father        colon skin cancer to  head   Cancer Daughter 9       breast cancer   Breast cancer Daughter     Social History   Tobacco Use   Smoking status: Never   Smokeless tobacco: Never  Substance Use Topics   Alcohol use: Yes    Comment: occasionally     Current Outpatient Medications:    cholecalciferol (VITAMIN D3) 25 MCG  (1000 UNIT) tablet, 1 each daily., Disp: , Rfl:    cyanocobalamin (VITAMIN B12) 1000 MCG tablet, Take 1,000 mcg by mouth daily., Disp: , Rfl:    metFORMIN (GLUCOPHAGE-XR) 500 MG 24 hr tablet, Take 1 tablet (500 mg total) by mouth daily with breakfast., Disp: 90 tablet, Rfl: 1   Vibegron  (GEMTESA ) 75 MG TABS, Take 1 tablet (75 mg total) by mouth daily., Disp: 30 tablet, Rfl: 11   atorvastatin  (LIPITOR) 20 MG tablet, Take 1 tablet (20 mg total) by mouth daily., Disp: 90 tablet, Rfl: 1   metoprolol  succinate (TOPROL -XL) 50 MG 24 hr tablet, Take 1 tablet (50 mg total) by mouth daily. Take with or immediately following a meal., Disp: 90 tablet, Rfl: 1   olmesartan  (BENICAR ) 40 MG tablet, Take 1 tablet (40 mg total) by mouth daily., Disp: 90 tablet, Rfl: 1  No Known Allergies  I personally reviewed active problem list, medication list, allergies, family history with the patient/caregiver today.   ROS  Ten systems reviewed and is negative except as mentioned in HPI    Objective Physical Exam  CONSTITUTIONAL: Patient appears well-developed and well-nourished.  No distress. HEENT: Head atraumatic, normocephalic, neck supple. CARDIOVASCULAR: Normal rate, regular rhythm and normal heart sounds.  No murmur heard. Non pitting  BLE edema. PULMONARY: Effort normal and breath sounds normal. No respiratory distress. ABDOMINAL: There is no tenderness or distention. MUSCULOSKELETAL: Normal gait. Without gross motor or sensory deficit. PSYCHIATRIC: Patient has a normal mood and affect. behavior is normal. Judgment and thought content normal.  Vitals:   04/15/24 1126 04/15/24 1158  BP: 130/78   Pulse: (!) 115 82  Resp: 18   Temp: 97.8 F (36.6 C)   SpO2: 96%   Weight: 271 lb 14.4 oz (123.3 kg)   Height: 5' 7 (1.702 m)     Body mass index is 42.59 kg/m.    PHQ2/9:    04/15/2024   11:27 AM 09/04/2023    9:33 AM 02/12/2023   10:35 AM 02/07/2023    9:57 AM 08/30/2022   11:16 AM  Depression  screen PHQ 2/9  Decreased Interest 0 0 0 0 0  Down, Depressed, Hopeless 0 0 0 0 0  PHQ - 2 Score 0 0 0 0 0  Altered sleeping 0 0 0  0  Tired, decreased energy 0 0 0  0  Change in appetite 0 0 0  0  Feeling bad or failure about yourself  0 0 0  0  Trouble concentrating 0 0 0  0  Moving slowly or fidgety/restless 0 0 0  0  Suicidal thoughts 0 0 0  0  PHQ-9 Score 0 0 0  0  Difficult doing work/chores Not difficult at all Not difficult at all       phq 9 is negative  Fall Risk:    04/15/2024   11:27 AM 09/04/2023    9:33 AM 02/12/2023   10:34 AM 02/06/2023    7:41 AM 10/01/2022   10:18 AM  Fall Risk   Falls in the past year? 0 0 0 0 0  Number falls in past yr: 0 0  0   Injury with Fall? 0 0  0   Risk for fall due to :  No Fall Risks No Fall Risks No Fall Risks   Follow up Falls evaluation completed Falls prevention discussed;Education provided;Falls evaluation completed Falls prevention discussed Education provided;Falls prevention discussed       Assessment & Plan Morbid obesity due to excess calories BMI over 40. Stress, inactivity, and poor diet contribute to weight gain. - Encouraged Silver Sneakers for physical activity. - Recommended Weight Watchers for diet. - Advised daily exercise routine, starting at 5 minutes. - Discussed GLP-1 agonists or Comtrave if lifestyle changes fail.  Essential hypertension Blood pressure improved but slightly elevated. Elevated heart rate due to deconditioning and obesity. - Continue olmesartan . - Monitor blood pressure at home. - Encouraged weight loss and increased activity.  Hyperlipidemia Lipid profile well-managed with atorvastatin . - Continue atorvastatin .  Prediabetes and hyperglycemia A1c at 5.8, fasting glucose elevated. Metformin considered for weight and glycemic control. - Prescribed metformin 500 mg. - Educated on metformin side effects. - Advised stopping metformin if dehydrated or before procedures.  Deficiency of B  group vitamins Low B12 levels, taking sublingual supplements. - Continue sublingual B12. - Recheck B12 levels next visit.  Vitamin D  deficiency Vitamin D  levels normalized with supplementation. - Continue vitamin D  supplementation.  Lymphedema Exacerbated by obesity. - Encouraged weight loss.  Exertional tachycardia and sob - likely due to deconditioning, she will try to gradually increase physical activity and join a gym if no improvement refer to Cardiologist   General Health Maintenance Cataract surgeries completed. Colonoscopy due. - Ensure colonoscopy paperwork is submitted.

## 2024-05-08 ENCOUNTER — Telehealth: Payer: Self-pay | Admitting: Family Medicine

## 2024-07-03 ENCOUNTER — Encounter: Payer: Self-pay | Admitting: Gastroenterology

## 2024-07-03 ENCOUNTER — Encounter
Admission: RE | Disposition: A | Discharge status: Home / Self Care | Payer: Self-pay | Source: Home / Self Care | Attending: Gastroenterology

## 2024-07-03 ENCOUNTER — Ambulatory Visit
Admission: RE | Admit: 2024-07-03 | Discharge: 2024-07-03 | Disposition: A | Discharge status: Home / Self Care | Attending: Gastroenterology | Admitting: Gastroenterology

## 2024-07-03 ENCOUNTER — Ambulatory Visit

## 2024-07-03 DIAGNOSIS — Z1211 Encounter for screening for malignant neoplasm of colon: Secondary | ICD-10-CM | POA: Diagnosis present

## 2024-07-03 DIAGNOSIS — Z800 Family history of malignant neoplasm of digestive organs: Secondary | ICD-10-CM | POA: Insufficient documentation

## 2024-07-03 DIAGNOSIS — E782 Mixed hyperlipidemia: Secondary | ICD-10-CM | POA: Diagnosis not present

## 2024-07-03 DIAGNOSIS — I10 Essential (primary) hypertension: Secondary | ICD-10-CM | POA: Insufficient documentation

## 2024-07-03 DIAGNOSIS — Z6841 Body Mass Index (BMI) 40.0 and over, adult: Secondary | ICD-10-CM | POA: Diagnosis not present

## 2024-07-03 DIAGNOSIS — E119 Type 2 diabetes mellitus without complications: Secondary | ICD-10-CM | POA: Diagnosis not present

## 2024-07-03 DIAGNOSIS — K5730 Diverticulosis of large intestine without perforation or abscess without bleeding: Secondary | ICD-10-CM | POA: Insufficient documentation

## 2024-07-03 DIAGNOSIS — Z7984 Long term (current) use of oral hypoglycemic drugs: Secondary | ICD-10-CM | POA: Diagnosis not present

## 2024-07-03 DIAGNOSIS — E669 Obesity, unspecified: Secondary | ICD-10-CM | POA: Diagnosis not present

## 2024-07-03 DIAGNOSIS — D122 Benign neoplasm of ascending colon: Secondary | ICD-10-CM | POA: Insufficient documentation

## 2024-07-03 DIAGNOSIS — K641 Second degree hemorrhoids: Secondary | ICD-10-CM | POA: Insufficient documentation

## 2024-07-03 MED ORDER — PROPOFOL 10 MG/ML IV BOLUS
INTRAVENOUS | Status: DC | PRN
  Administered 2024-07-03: 20 mg via INTRAVENOUS
  Administered 2024-07-03: 60 mg via INTRAVENOUS
  Administered 2024-07-03: 20 mg via INTRAVENOUS

## 2024-07-03 MED ORDER — SODIUM CHLORIDE 0.9 % IV SOLN
INTRAVENOUS | Status: DC
  Administered 2024-07-03: 20 mL/h via INTRAVENOUS

## 2024-07-03 MED ORDER — GLYCOPYRROLATE 0.2 MG/ML IJ SOLN
INTRAMUSCULAR | Status: DC | PRN
  Administered 2024-07-03: .2 mg via INTRAVENOUS

## 2024-07-03 MED ORDER — LIDOCAINE HCL (CARDIAC) PF 100 MG/5ML IV SOSY
PREFILLED_SYRINGE | INTRAVENOUS | Status: DC | PRN
  Administered 2024-07-03: 100 mg via INTRAVENOUS

## 2024-07-03 MED ORDER — PROPOFOL 500 MG/50ML IV EMUL
INTRAVENOUS | Status: DC | PRN
  Administered 2024-07-03: 165 ug/kg/min via INTRAVENOUS

## 2024-07-03 MED ORDER — DEXMEDETOMIDINE HCL IN NACL 200 MCG/50ML IV SOLN
INTRAVENOUS | Status: DC | PRN
  Administered 2024-07-03: 12 ug via INTRAVENOUS

## 2024-07-03 MED ORDER — EPHEDRINE SULFATE-NACL 50-0.9 MG/10ML-% IV SOSY
PREFILLED_SYRINGE | INTRAVENOUS | Status: DC | PRN
  Administered 2024-07-03: 5 mg via INTRAVENOUS

## 2024-07-03 NOTE — Anesthesia Preprocedure Evaluation (Signed)
"                                    Anesthesia Evaluation  Patient identified by MRN, date of birth, ID band Patient awake    Reviewed: Allergy & Precautions, H&P , NPO status , Patient's Chart, lab work & pertinent test results  Airway Mallampati: II  TM Distance: >3 FB Neck ROM: Full    Dental no notable dental hx.    Pulmonary neg pulmonary ROS   Pulmonary exam normal breath sounds clear to auscultation       Cardiovascular hypertension, negative cardio ROS Normal cardiovascular exam Rhythm:Regular Rate:Normal     Neuro/Psych negative neurological ROS  negative psych ROS   GI/Hepatic negative GI ROS, Neg liver ROS,GERD  ,,  Endo/Other  negative endocrine ROS    Renal/GU negative Renal ROS  negative genitourinary   Musculoskeletal negative musculoskeletal ROS (+)    Abdominal   Peds negative pediatric ROS (+)  Hematology negative hematology ROS (+)   Anesthesia Other Findings Previous cataract 03-19-24 Dr. Ola  Hypertension  High cholesterol Mixed hyperlipidemia  Pre-diabetes    Reproductive/Obstetrics negative OB ROS                              Anesthesia Physical Anesthesia Plan  ASA: 3  Anesthesia Plan: General   Post-op Pain Management:    Induction: Intravenous  PONV Risk Score and Plan: TIVA and Propofol  infusion  Airway Management Planned: Natural Airway  Additional Equipment:   Intra-op Plan:   Post-operative Plan:   Informed Consent: I have reviewed the patients History and Physical, chart, labs and discussed the procedure including the risks, benefits and alternatives for the proposed anesthesia with the patient or authorized representative who has indicated his/her understanding and acceptance.     Dental Advisory Given  Plan Discussed with: Anesthesiologist, CRNA and Surgeon  Anesthesia Plan Comments: (ivga)         Anesthesia Quick Evaluation  "

## 2024-07-03 NOTE — Interval H&P Note (Signed)
 History and Physical Interval Note:  07/03/2024 10:13 AM  Pamela Dixon  has presented today for surgery, with the diagnosis of Personal history of adenomatous and serrated colon polyps (Z86.0101) Family history of colon cancer (Z80.0) Family hx colonic polyps (Z83.719).  The various methods of treatment have been discussed with the patient and family. After consideration of risks, benefits and other options for treatment, the patient has consented to  Procedures: COLONOSCOPY (N/A) as a surgical intervention.  The patient's history has been reviewed, patient examined, no change in status, stable for surgery.  I have reviewed the patient's chart and labs.  Questions were answered to the patient's satisfaction.     Pamela Dixon  Ok to proceed with colonoscopy

## 2024-07-03 NOTE — Anesthesia Postprocedure Evaluation (Signed)
"   Anesthesia Post Note  Patient: Pamela Dixon  Procedure(s) Performed: COLONOSCOPY POLYPECTOMY, INTESTINE  Patient location during evaluation: PACU Anesthesia Type: General Level of consciousness: awake and alert Pain management: pain level controlled Vital Signs Assessment: post-procedure vital signs reviewed and stable Respiratory status: spontaneous breathing, nonlabored ventilation, respiratory function stable and patient connected to nasal cannula oxygen Cardiovascular status: blood pressure returned to baseline and stable Postop Assessment: no apparent nausea or vomiting Anesthetic complications: no   No notable events documented.   Last Vitals:  Vitals:   07/03/24 1107 07/03/24 1117  BP: 103/65 111/61  Pulse:    Resp: (!) 22 (!) 31  Temp:    SpO2: 99%     Last Pain:  Vitals:   07/03/24 1107  TempSrc:   PainSc: 0-No pain                 Shau-Shau Melia      "

## 2024-07-03 NOTE — H&P (Signed)
 Outpatient short stay form Pre-procedure 07/03/2024  Pamela ONEIDA Schick, MD  Primary Physician: Sowles, Krichna, MD  Reason for visit:  Surveillance  History of present illness:    69 y/o lady with history of HLD, DM II, hypertension, and obesity here for colonoscopy for history of polyps. No blood thinners. Father with colon cancer in his 24's. No significant abdominal surgeries.   Current Medications[1]  Medications Prior to Admission  Medication Sig Dispense Refill Last Dose/Taking   atorvastatin  (LIPITOR) 20 MG tablet Take 1 tablet (20 mg total) by mouth daily. 90 tablet 1 07/03/2024 at  7:00 AM   cholecalciferol (VITAMIN D3) 25 MCG (1000 UNIT) tablet 1 each daily.   Past Week   cyanocobalamin (VITAMIN B12) 1000 MCG tablet Take 1,000 mcg by mouth daily.   Past Week   metFORMIN  (GLUCOPHAGE -XR) 500 MG 24 hr tablet Take 1 tablet (500 mg total) by mouth daily with breakfast. 90 tablet 1 Past Week   metoprolol  succinate (TOPROL -XL) 50 MG 24 hr tablet Take 1 tablet (50 mg total) by mouth daily. Take with or immediately following a meal. 90 tablet 1 07/03/2024 at  7:00 AM   olmesartan  (BENICAR ) 40 MG tablet Take 1 tablet (40 mg total) by mouth daily. 90 tablet 1 07/03/2024 at  7:00 AM   Vibegron  (GEMTESA ) 75 MG TABS Take 1 tablet (75 mg total) by mouth daily. 30 tablet 11 Past Week     Allergies[2]   Past Medical History:  Diagnosis Date   High cholesterol    Hypertension    Mixed hyperlipidemia    Pre-diabetes    diet controlled    Review of systems:  Otherwise negative.    Physical Exam  Gen: Alert, oriented. Appears stated age.  HEENT: PERRLA. Lungs: No respiratory distress CV: RRR Abd: soft, benign, no masses Ext: No edema    Planned procedures: Proceed with colonoscopy. The patient understands the nature of the planned procedure, indications, risks, alternatives and potential complications including but not limited to bleeding, infection, perforation, damage to  internal organs and possible oversedation/side effects from anesthesia. The patient agrees and gives consent to proceed.  Please refer to procedure notes for findings, recommendations and patient disposition/instructions.     Pamela ONEIDA Schick, MD [facility]         [1]  Current Facility-Administered Medications:    0.9 %  sodium chloride  infusion, , Intravenous, Continuous, Adalynn Corne, Pamela ONEIDA, MD, Last Rate: 20 mL/hr at 07/03/24 0946, 20 mL/hr at 07/03/24 0946 [2] No Known Allergies

## 2024-07-03 NOTE — Op Note (Signed)
 [facility] Patient Name: Pamela Dixon Procedure Date: 07/03/2024 10:11 AM MRN: [id number] Account #: [id number] Date of Birth: 1955/08/03 Admit Type: Outpatient Age: 69 Room: [facility] ENDO ROOM 3 Gender: Female Note Status: Finalized Instrument Name: Colon Scope [id number] Procedure:             Colonoscopy Indications:           Surveillance: Personal history of adenomatous polyps                         on last colonoscopy 3 years ago Providers:             Ole Schick MD, MD Referring MD:          Dorette FALCON. Sowles, MD (Referring MD) Medicines:             Monitored Anesthesia Care Complications:         No immediate complications. Estimated blood loss:                         Minimal. Procedure:             Pre-Anesthesia Assessment:                        - Prior to the procedure, a History and Physical was                         performed, and patient medications and allergies were                         reviewed. The patient is competent. The risks and                         benefits of the procedure and the sedation options and                         risks were discussed with the patient. All questions                         were answered and informed consent was obtained.                         Patient identification and proposed procedure were                         verified by the physician, the nurse, the                         anesthesiologist, the anesthetist and the technician                         in the endoscopy suite. Mental Status Examination:                         alert and oriented. Airway Examination: normal                         oropharyngeal airway and neck mobility. Respiratory  Examination: clear to auscultation. CV Examination:                         normal. Prophylactic Antibiotics: The patient does not                         require prophylactic antibiotics. Prior                          Anticoagulants: The patient has taken no anticoagulant                         or antiplatelet agents. ASA Grade Assessment: III - A                         patient with severe systemic disease. After reviewing                         the risks and benefits, the patient was deemed in                         satisfactory condition to undergo the procedure. The                         anesthesia plan was to use monitored anesthesia care                         (MAC). Immediately prior to administration of                         medications, the patient was re-assessed for adequacy                         to receive sedatives. The heart rate, respiratory                         rate, oxygen saturations, blood pressure, adequacy of                         pulmonary ventilation, and response to care were                         monitored throughout the procedure. The physical                         status of the patient was re-assessed after the                         procedure.                        After obtaining informed consent, the colonoscope was                         passed under direct vision. Throughout the procedure,                         the patient's blood pressure, pulse, and oxygen  saturations were monitored continuously. The                         Colonoscope was introduced through the anus and                         advanced to the the cecum, identified by appendiceal                         orifice and ileocecal valve. The colonoscopy was                         performed without difficulty. The patient tolerated                         the procedure well. The quality of the bowel                         preparation was good. The ileocecal valve, appendiceal                         orifice, and rectum were photographed. Findings:      The perianal and digital rectal examinations were normal.      Two sessile polyps were found in the  ascending colon. The polyps were 3       to 4 mm in size. These polyps were removed with a cold snare. Resection       and retrieval were complete. Estimated blood loss was minimal.      A few small-mouthed diverticula were found in the sigmoid colon and       descending colon.      Internal hemorrhoids were found during retroflexion. The hemorrhoids       were Grade II (internal hemorrhoids that prolapse but reduce       spontaneously).      The exam was otherwise without abnormality on direct and retroflexion       views. Impression:            - Two 3 to 4 mm polyps in the ascending colon, removed                         with a cold snare. Resected and retrieved.                        - Diverticulosis in the sigmoid colon and in the                         descending colon.                        - Internal hemorrhoids.                        - The examination was otherwise normal on direct and                         retroflexion views. Recommendation:        - Discharge patient to home.                        -  Resume previous diet.                        - Continue present medications.                        - Await pathology results.                        - Repeat colonoscopy in 5 years for surveillance.                        - Return to referring physician as previously                         scheduled. Procedure Code(s):     --- Professional ---                        [phone], Colonoscopy, flexible; with removal of                         tumor(s), polyp(s), or other lesion(s) by snare                         technique Diagnosis Code(s):     --- Professional ---                        Z86.010, Personal history of colonic polyps                        D12.2, Benign neoplasm of ascending colon                        K64.1, Second degree hemorrhoids                        K57.30, Diverticulosis of large intestine without                         perforation or abscess without  bleeding CPT copyright 2022 [employer]. All rights reserved. The codes documented in this report are preliminary and upon coder review may  be revised to meet current compliance requirements. Ole Schick MD, MD 07/03/2024 10:41:04 AM Number of Addenda: 0 Note Initiated On: 07/03/2024 10:11 AM Scope Withdrawal Time: 0 hours 8 minutes 49 seconds  Total Procedure Duration: 0 hours 11 minutes 25 seconds  Estimated Blood Loss:  Estimated blood loss was minimal.      [facility]

## 2024-07-03 NOTE — Anesthesia Procedure Notes (Signed)
 Procedure Name: General with mask airway Date/Time: 07/03/2024 10:15 AM  Performed by: Ledora Duncan, CRNAPre-anesthesia Checklist: Patient identified, Emergency Drugs available, Suction available and Patient being monitored Patient Re-evaluated:Patient Re-evaluated prior to induction Oxygen Delivery Method: Simple face mask Induction Type: IV induction Placement Confirmation: positive ETCO2 and breath sounds checked- equal and bilateral Dental Injury: Teeth and Oropharynx as per pre-operative assessment

## 2024-07-03 NOTE — Transfer of Care (Signed)
 Immediate Anesthesia Transfer of Care Note  Patient: Pamela Dixon  Procedure(s) Performed: COLONOSCOPY POLYPECTOMY, INTESTINE  Patient Location: Endoscopy Unit  Anesthesia Type:General  Level of Consciousness: drowsy and patient cooperative  Airway & Oxygen Therapy: Patient Spontanous Breathing and Patient connected to face mask oxygen  Post-op Assessment: Report given to RN and Post -op Vital signs reviewed and stable  Post vital signs: Reviewed and stable  Last Vitals:  Vitals Value Taken Time  BP    Temp    Pulse 93 07/03/24 10:43  Resp 26 07/03/24 10:43  SpO2 99 % 07/03/24 10:43  Vitals shown include unfiled device data.  Last Pain:  Vitals:   07/03/24 1037  TempSrc:   PainSc: 0-No pain         Complications: No notable events documented.

## 2024-07-06 LAB — SURGICAL PATHOLOGY

## 2024-09-07 ENCOUNTER — Ambulatory Visit: Admitting: Urology

## 2024-10-15 ENCOUNTER — Ambulatory Visit: Admitting: Family Medicine
